# Patient Record
Sex: Male | Born: 1960 | Race: White | Hispanic: No | Smoking: Current every day smoker
Health system: Southern US, Community
[De-identification: ages and names within clinical notes are randomized; demographics above are authoritative.]

## PROBLEM LIST (undated history)

## (undated) DIAGNOSIS — F32A Depression, unspecified: Secondary | ICD-10-CM

## (undated) DIAGNOSIS — I872 Venous insufficiency (chronic) (peripheral): Secondary | ICD-10-CM

## (undated) DIAGNOSIS — E7800 Pure hypercholesterolemia, unspecified: Secondary | ICD-10-CM

## (undated) DIAGNOSIS — K759 Inflammatory liver disease, unspecified: Secondary | ICD-10-CM

## (undated) DIAGNOSIS — F419 Anxiety disorder, unspecified: Secondary | ICD-10-CM

## (undated) DIAGNOSIS — K5900 Constipation, unspecified: Secondary | ICD-10-CM

## (undated) DIAGNOSIS — Z86718 Personal history of other venous thrombosis and embolism: Secondary | ICD-10-CM

## (undated) DIAGNOSIS — E039 Hypothyroidism, unspecified: Secondary | ICD-10-CM

## (undated) DIAGNOSIS — F329 Major depressive disorder, single episode, unspecified: Secondary | ICD-10-CM

## (undated) HISTORY — PX: BACK SURGERY: SHX140

## (undated) HISTORY — DX: Personal history of other venous thrombosis and embolism: Z86.718

## (undated) HISTORY — PX: BELOW KNEE LEG AMPUTATION: SUR23

## (undated) HISTORY — DX: Venous insufficiency (chronic) (peripheral): I87.2

## (undated) HISTORY — PX: FOOT SURGERY: SHX648

---

## 1999-05-11 ENCOUNTER — Inpatient Hospital Stay (HOSPITAL_COMMUNITY): Admission: AD | Admit: 1999-05-11 | Discharge: 1999-05-14 | Payer: Self-pay | Admitting: Psychiatry

## 2001-01-04 ENCOUNTER — Encounter (HOSPITAL_COMMUNITY): Admission: RE | Admit: 2001-01-04 | Discharge: 2001-02-03 | Payer: Self-pay | Admitting: Rheumatology

## 2001-01-05 ENCOUNTER — Encounter: Payer: Self-pay | Admitting: Rheumatology

## 2001-07-31 ENCOUNTER — Inpatient Hospital Stay (HOSPITAL_COMMUNITY): Admission: AD | Admit: 2001-07-31 | Discharge: 2001-08-02 | Payer: Self-pay | Admitting: Psychiatry

## 2003-06-26 ENCOUNTER — Ambulatory Visit (HOSPITAL_COMMUNITY): Admission: RE | Admit: 2003-06-26 | Discharge: 2003-06-26 | Payer: Self-pay | Admitting: Family Medicine

## 2004-11-15 ENCOUNTER — Encounter: Admission: RE | Admit: 2004-11-15 | Discharge: 2005-02-13 | Payer: Self-pay | Admitting: Anesthesiology

## 2004-11-30 ENCOUNTER — Ambulatory Visit: Payer: Self-pay | Admitting: Anesthesiology

## 2005-03-08 ENCOUNTER — Ambulatory Visit (HOSPITAL_COMMUNITY): Admission: RE | Admit: 2005-03-08 | Discharge: 2005-03-08 | Payer: Self-pay | Admitting: Family Medicine

## 2005-03-15 ENCOUNTER — Encounter: Admission: RE | Admit: 2005-03-15 | Discharge: 2005-06-13 | Payer: Self-pay | Admitting: Anesthesiology

## 2005-03-15 ENCOUNTER — Ambulatory Visit: Payer: Self-pay | Admitting: Anesthesiology

## 2005-03-21 ENCOUNTER — Ambulatory Visit (HOSPITAL_COMMUNITY): Admission: RE | Admit: 2005-03-21 | Discharge: 2005-03-21 | Payer: Self-pay | Admitting: Family Medicine

## 2005-05-03 ENCOUNTER — Ambulatory Visit: Payer: Self-pay | Admitting: Anesthesiology

## 2005-05-31 ENCOUNTER — Ambulatory Visit: Payer: Self-pay | Admitting: Physical Medicine and Rehabilitation

## 2005-06-06 ENCOUNTER — Encounter
Admission: RE | Admit: 2005-06-06 | Discharge: 2005-09-04 | Payer: Self-pay | Admitting: Physical Medicine and Rehabilitation

## 2007-03-27 ENCOUNTER — Ambulatory Visit (HOSPITAL_COMMUNITY): Admission: RE | Admit: 2007-03-27 | Discharge: 2007-03-27 | Payer: Self-pay | Admitting: Neurosurgery

## 2007-04-04 ENCOUNTER — Inpatient Hospital Stay (HOSPITAL_COMMUNITY): Admission: RE | Admit: 2007-04-04 | Discharge: 2007-04-07 | Payer: Self-pay | Admitting: Neurosurgery

## 2008-11-03 ENCOUNTER — Ambulatory Visit (HOSPITAL_COMMUNITY): Admission: RE | Admit: 2008-11-03 | Discharge: 2008-11-03 | Payer: Self-pay | Admitting: Family Medicine

## 2009-11-05 ENCOUNTER — Ambulatory Visit (HOSPITAL_COMMUNITY): Admission: RE | Admit: 2009-11-05 | Discharge: 2009-11-05 | Payer: Self-pay | Admitting: Family Medicine

## 2010-06-11 ENCOUNTER — Ambulatory Visit (HOSPITAL_COMMUNITY)
Admission: RE | Admit: 2010-06-11 | Discharge: 2010-06-11 | Payer: Self-pay | Source: Home / Self Care | Admitting: Family Medicine

## 2010-08-01 ENCOUNTER — Encounter: Payer: Self-pay | Admitting: Family Medicine

## 2010-08-20 ENCOUNTER — Ambulatory Visit (HOSPITAL_COMMUNITY)
Admission: RE | Admit: 2010-08-20 | Discharge: 2010-08-20 | Disposition: A | Discharge status: Home / Self Care | Payer: BC Managed Care – PPO | Source: Ambulatory Visit | Attending: Family Medicine | Admitting: Family Medicine

## 2010-08-20 ENCOUNTER — Encounter (HOSPITAL_COMMUNITY): Payer: Self-pay

## 2010-08-20 ENCOUNTER — Other Ambulatory Visit (HOSPITAL_COMMUNITY): Payer: Self-pay | Admitting: Family Medicine

## 2010-08-20 DIAGNOSIS — R05 Cough: Secondary | ICD-10-CM | POA: Insufficient documentation

## 2010-08-20 DIAGNOSIS — R053 Chronic cough: Secondary | ICD-10-CM

## 2010-08-20 DIAGNOSIS — F17200 Nicotine dependence, unspecified, uncomplicated: Secondary | ICD-10-CM

## 2010-08-20 DIAGNOSIS — R059 Cough, unspecified: Secondary | ICD-10-CM | POA: Insufficient documentation

## 2010-11-23 NOTE — Op Note (Signed)
NAMERAYE, SLYTER NO.:  [id number]   MEDICAL RECORD NO.:  [id number]          PATIENT TYPE:  INP   LOCATION:  3041                         FACILITY:  [facility]   PHYSICIAN:  Hilda Lias, M.D.   DATE OF BIRTH:  10/01/1960   DATE OF PROCEDURE:  04/04/2007  DATE OF DISCHARGE:                               OPERATIVE REPORT   PREOPERATIVE DIAGNOSIS:  Degenerative disk disease, L5-S1, with chronic  radiculopathy and chronic back pain.   POSTOPERATIVE DIAGNOSES:  Degenerative disk disease, L5-S1, with chronic  radiculopathy and chronic back pain.   PROCEDURE:  1. Bilateral L5-S1 diskectomy.  2. Interbody fusion with cage and pedicle screws at L5-S1,      posterolateral arthrodesis.  Bone-morphogenic proteins autograft.   SURGEON:  Hilda Lias, M.D.   ASSISTANT:  Payton Doughty, M.D.   CLINICAL HISTORY:  [name] is a 50 year old gentleman who I have been  following for more than 7 years in my office because of back pain with  radiation to both legs.  He is getting worse, despite every single  conservative treatment.  In the end, he wanted to proceed with surgery.  The risks were explained to him as in the history and physical.   PROCEDURE:  [name] was taken to the OR and he was positioned in a  prone manner.  The back was cleaned with DuraPrep.  A midline incision  from L4-L5 to S1 was made and muscles were retracted laterally until we  were able to see and feel the transverse process of L5.  X-rays showed  that we were at the level of L5-S1.  From then on, the spinous process  and the lamina, as well as the facet of L5 were removed.  The patient  had quite a bit of hypertrophy of the facet.  He had quite a bit of  adhesion and lysis was accomplished.  We were going to retract the L5-S1  nerve root and we entered the disk space, which was quite narrow.  We  introduced microcurettes and at the end, we were able to remove the disk  bilaterally.  The  endplate was shaved.  Then 2 cages of 8 x 22 with BMP  at the tip autograft for the rest were introduced.  The rest of the disk  space was filled up with bone graft..  X-rays showed that we were in  good position with both of our cages.  Then with the C-arm first in an  AP view and then a lateral view, we were able to drill the pedicle of L5-  S1.  Then pedicle probes were introduced.  Then 4 screws 5.5 x 45 at the  level of L5 and 5.5 x 50 at the level of S1 were introduced.  Good  alignment was achieved.  Then the pedicles were kept in place with a rod  and kept in place with caps.  We went laterally and we removed the  periosteum of the  ala of the sacrum, as well as the transverse process and the lateral  aspect of  the facet.  Arthrodesis using autograft and BMP was  accomplished.  The area was irrigated.  Fentanyl was left in the  epidural space and the wound was closed with Vicryl and Steri-Strips.           ______________________________  Hilda Lias, M.D.     EB/MEDQ  D:  04/04/2007  T:  04/05/2007  Job:  [id number]

## 2010-11-23 NOTE — H&P (Signed)
[name], [name] NO.:  [id number]   MEDICAL RECORD NO.:  [id number]          PATIENT TYPE:  INP   LOCATION:  3041                         FACILITY:  [facility]   PHYSICIAN:  Hilda Lias, M.D.   DATE OF BIRTH:  10-Mar-1961   DATE OF ADMISSION:  04/04/2007  DATE OF DISCHARGE:                              HISTORY & PHYSICAL   HISTORY OF PRESENT ILLNESS:  [name] is a gentleman who has been seen  by me since April 2002, complaining of low back pain which is getting  worse for the past 6 years.  The pain goes down to both legs and he is  quite miserable.  The patient had had conservative treatment including  pain clinic, physical therapy, medication and he is not any better.  We  have not done several x-ray through the years and indeed, the x-ray is  getting worse to the point that he has severe degenerative disk disease  at the level 5-1.  Lately, he had been unable to work and because of  that, he wants to proceed with surgery.   PAST MEDICAL HISTORY:  Vasectomy.   SOCIAL HISTORY:  He does not drink.  He smokes a pack a day.   FAMILY HISTORY:  Father had cancer of the prostate.  Mother had a  stroke.   REVIEW OF SYSTEMS:  Positive for anxiety, back and leg pain, arthritis.   PHYSICAL EXAMINATION:  HEENT:  Normal.  NECK:  Normal.  LUNGS:  Clear.  CARDIAC:  Heart sounds normal.  ABDOMEN:  Normal.  EXTREMITIES:  Norma pulses.  NEUROLOGIC:  He has some mild weakness of dorsiflexion of both feet.  Straight leg raising, SLR, is positive at 45 degrees.  He has decreased  flexibility of the lumbar spine.   IMAGING STUDY:  Lumbar spine as well as the MRI shows severe  degenerative disk disease at the level of 5-1.   CLINICAL IMPRESSION:  Degenerative disk disease, L5-S1, with a chronic  radiculopathy.   RECOMMENDATIONS:  The patient wants to proceed with surgery.  The  procedure will be an L5-S1 diskectomy, interbody fusion with pedicle  screws using the  BMP and autograft.  The patient knows about the risks  such as infection, CSF leak, worsening pain, bleeding of the abdomen,  need for surgery, failure of the graft and no improvement because of the  chronicity of the pain.           ______________________________  Hilda Lias, M.D.    EB/MEDQ  D:  04/04/2007  T:  04/05/2007  Job:  [id number]

## 2010-11-23 NOTE — Discharge Summary (Signed)
NAMEKAYLOR, SIMENSON NO.:  [id number]   MEDICAL RECORD NO.:  [id number]          PATIENT TYPE:  INP   LOCATION:  3041                         FACILITY:  [facility]   PHYSICIAN:  Hilda Lias, M.D.   DATE OF BIRTH:  1961/03/12   DATE OF ADMISSION:  04/04/2007  DATE OF DISCHARGE:  04/07/2007                               DISCHARGE SUMMARY   ADMISSION DIAGNOSES:  Degenerative disk disease, L5-S1, with chronic  radiculopathy.   HISTORY:  The patient has been complaining of back pain for almost seven  years.  I have been following him for almost six years.  He is getting  worse.  X-rays showed degenerative disk disease at the L5-S1 video.  Surgery was advised.   LABORATORY DATA:  Normal.   HOSPITAL COURSE:  On the day of admission, the patient was taken to  surgery and a fusion of L5-S1 was done.  The first 48-hours, the patient  complained of quite a bit of pain but today he is doing much better.  He  is walking.  He has minimal pain.  No weakness in the legs.  He feels  that he is ready to go home.   CONDITION ON DISCHARGE:  Improved.   DISCHARGE MEDICATIONS:  1. Percocet.  2. Diazepam.  3. He is to continue all of his previous medications.   DISCHARGE DIET:  Regular.   DISCHARGE ACTIVITIES:  Not to do for at least two weeks.   FOLLOWUP:  To be seen by me in the second week of November or before as  needed.           ______________________________  Hilda Lias, M.D.     EB/MEDQ  D:  04/07/2007  T:  04/07/2007  Job:  [id number]
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
## 2010-11-26 NOTE — Assessment & Plan Note (Signed)
[name] comes in for pain management today.  I evaluated him with a  __________ history form and a 14-point review of systems.   KIND OF REFERRAL:  Referral from Dr. Jeral Fruit.   He has been assessed from a surgical standpoint; we are consulted for  consideration of management of pain in the lumbar region and possible  interventional procedures to improve function and quality of life indices.  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  He has had steady escalation  of back pain, declining functional indices and quality of life indices.  I  reviewed the MRI, which revealed degenerative components to the lower lumbar  segments, particularly 5145 with facettal overlay.  He is a smoker, and I  discussed modifiable features and help profile to improve function and  quality of life and will maintain contact with primary care.  He apparently  does not see primary care often, and I encouraged.  This is an outcome  predictive element, and I review it within the context of overall best  outcome, and surgery is considered imminent.  He wants to do what he can to  avoid moving into the surgical arena as he does have financial stressors.  His pain is a 6/10 subjective scale, dull, achy, and throbbing with no  temporal relation to the day, made worse by standing and activity.  It is  worse at night.  He has difficulty with most ambulatory activities.  His 14-  point review of systems, health and history form reviewed.  Elements of  review of systems, past medical and social history are otherwise accompanied  to the charts.  Medications reviewed as well.  He is currently not taking  narcotic-based pain medication, relying on Lidoderm and Skelaxin.   PHYSICAL EXAMINATION:  GENERAL:  Physical exam reveals a pleasant male,  sitting comfortably in bed.  Good affect.  Appearance is normal.  Oriented  x3.  HEENT:   Unremarkable.  CHEST:  Clear to auscultation and percussion.  HEART:  Regular rate and rhythm without rub, murmur, or gallop.  ABDOMEN:  Soft, nontender, benign, no hepatosplenomegaly.  He has diffuse  paralumbar myofascial pain over PSIS and notable pain on extension.  Gaenslen's and  Patrick's equivocal.  NEUROLOGIC:  He has no other overt neurological deficits.  Motor and sensory  __________ .   IMPRESSION:  Degenerative spine disease, lumbar spine facets and  radiculopathy.   PLAN:  1.  We will arrange for lumbar epidural.  He will have a driver, and I have      reviewed the risks, complications, and options.  2.  Cigarette cessation for best outcome.  3.  In the interim, I will go ahead and give him a few hydrocodone with      cautions given.  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  He also understands the risk of potential      habituation and utilization of this medication within the context of      functional enhancements.  4.  He will maintain contact with Dr. Murray Hodgkins.  5.  I will see him in followup for this interventional procedure.  Consider      facettal injection with positive provocative __________ , consideration      of radiofrequency neuro ablation  at a later date if necessary.  There is      no barrier to communication.  He understands the opioid consent and      patient care agreement, and adequate time was allotted for questions.      HH/MedQ  D:  11/30/2004 10:21:53  T:  11/30/2004 11:04:55  Job #:  [id number]   cc:   Dr. Phillips Odor

## 2010-11-26 NOTE — Procedures (Signed)
NAMEMADDEN, GARRON NO.:  [id number]   MEDICAL RECORD NO.:  [id number]          PATIENT TYPE:  REC   LOCATION:  TPC                          FACILITY:  [facility]   PHYSICIAN:  Celene Kras, MD        DATE OF BIRTH:  [birth year]   DATE OF PROCEDURE:  05/03/2005  DATE OF DISCHARGE:                                 OPERATIVE REPORT   PATIENT:  [name].   DATE OF BIRTH:  [birth year].   SURGEON:  Jewel Baize. Stevphen Rochester, M.D.   Lenis [facility]ettleton [facility] and I evaluated  him. I have reviewed the Health and history form. I reviewed the 14 point  review of systems.   I reviewed MRI and progress to date. Directed leftward, his pain has more a  radicular pattern consistent with 5-1/4-5 to the left although he does have  some right sided pain and overlay to a mechanical component. He has both a  mixed anterior and posterior compartment element, facet and radicular pain,  supported by the MRI and physical findings. As predictably, he has pain in a  5-1 distribution as well as 4-5, demonstrated MRI, and probably some neural  encroachment particularly at the 5-1 level. He has no advancing neurological  features, bowel and bladder are intact, EHL is fine. I am going to refer him  on to the neurosurgeon, but he wants to wait, see how the injections go, and  that is fine for now, but should he have any advancing neurological features  or from a pain management perspective we do not make significant progress,  we are going to have to involve our surgical colleagues. He saw Dr. Jeral Fruit a  number of years, and he states that he really cannot afford to see a  neurosurgeon at this time, and he has to work, and he would not even  consider surgery anyway. To this end, we will trial him on a multimodality  standpoint, inject him, follow him along expectantly, and maximize  conservative management. This does include and he is clear on this that he  maintain  contact with primary care for cigarette cessation and lifestyle  enhancement. We will consider TENS technology or muscle stimulator. We want  to minimize controlled substances, but I will go ahead and give him a few  Percocet during the injection cycling to utilizing sparingly and  appropriately. He understands the risks of these medications, cautions  given, opioid consent, patient care agreement.   OBJECTIVE:  Diffuse paralumbar myofascial discomfort with pain over PSIS,  side bending positive, right and left. Straight leg left impaired, left  greater than right. EHL is fine. Radicular component over the mechanical  component today. No new neurological findings motor, sensory or reflexive.   IMPRESSION:  Radiculopathy, degenerative spine disease of lumbar spine.   PLAN:  1.  Most problematic site is left side, much more so than right, and so I      will focus on left transforaminal injection today, L4-5, 5-1 independent  needle axis points under local anesthetic. Diagnostic and therapeutic.      Will follow up with him in a month, and I do not plan another injection.      I would like to see how he does. In the interim, he will see primary      care and start working on lifestyle components that will affect outcome      such as cigarette cessation.  2.  He will see our physiatry colleagues to assess the biomechanical model.  3.  He understands the risks of this procedure and wishes to proceed.   The patient was taken to the fluoroscopy suite and placed in prone position,  the back prepped and draped in usual fashion. Using a 22-gauge blunted  spinal needle under local anesthetic, I advanced the transforaminal epidural  space at L4-5 and 5-1 independent needle axis points, left side. I confirmed  placement. I used multiple fluoroscopic positions and Isovue 200. Once  confirmation is made and contrast appropriate, we inject in a withdrawn  needle technique 2 cc of lidocaine 1% MPF  and 20 mg of Aristocort at each  level.   He tolerated the procedure well. No complication from our procedure.  Appropriate recovery ______________ instructions given. No barrier to  communication. I will see him in followup.           ______________________________  Celene Kras, MD     HH/MEDQ  D:  05/03/2005 11:37:58  T:  05/03/2005 16:07:25  Job:  [id number]

## 2010-11-26 NOTE — Procedures (Signed)
NAMETHAINE, GARRIGA NO.:  [id number]   MEDICAL RECORD NO.:  [id number]          PATIENT TYPE:  REC   LOCATION:  TPC                          FACILITY:  [facility]   PHYSICIAN:  Celene Kras, MD        DATE OF BIRTH:  01-08-1961   DATE OF PROCEDURE:  12/28/2004  DATE OF DISCHARGE:                                 OPERATIVE REPORT   PATIENT:  [name].   DATE OF BIRTH:  1961-06-22.   SURGEON:  Jewel Baize. Stevphen Rochester, M.D.   [name] and I evaluated  him. I have reviewed the Health and history form. I reviewed the 14 point  review of systems.   1.  He is scheduled for lumbar epidural. Risks, complications, and options      are fully outlined.  2.  Other modifiable features in health profile such as cigarette cessation      are discussed.  3.  I will follow up with him to determine further course of care. Consider      second injection if warranted. Consider facetal injection as well. This      is elaborated to him.  No barriers to communication. Discussed with him, adequate time to answer  questions.   OBJECTIVE:  Diffuse paralumbar myofascial discomfort, pain over PSIS,  notable pain on extension with Gaenslen's and Patrick's equivocal. No new  neurological findings motor, sensory or reflexive.   IMPRESSION:  Degenerative spine disease of lumbar spine.   PLAN:  Lumbar epidural. He has consented.   The patient taken to the fluoroscopy suite and placed in prone position.  Back prepped and draped in usual fashion. Using a Hustead needle, I advanced  to the L5-S1 interspace without any evidence of CSF, heme or paresthesia.  Test block uneventfully. Followed with 40 mg of Aristocort and flushed  needle.   He tolerated the procedure well. No complications from our procedure.  Appropriate recovery. Discharge instructions given. We will see him in  followup.       HH/MEDQ  D:  12/28/2004 11:44:07  T:   12/28/2004 12:53:45  Job:  [id number]

## 2010-11-26 NOTE — Procedures (Signed)
NAMEURIAN, MARTENSON NO.:  [id number]   MEDICAL RECORD NO.:  [id number]          PATIENT TYPE:  REC   LOCATION:  TPC                          FACILITY:  [facility]   PHYSICIAN:  Celene Kras, MD        DATE OF BIRTH:  December 09, 1960   DATE OF PROCEDURE:  DATE OF DISCHARGE:                                 OPERATIVE REPORT   PATIENT:  [name].   DATE OF BIRTH:  09/29/1960.   SURGEON:  Jewel Baize. Stevphen Rochester, M.D.   [name] and I evaluated  and reviewed the Health and history form. I reviewed the 14 point review of  systems.   1.  It has been a number of months since we have seen [name]. He had a fair to      equivocal response from lumbar epidural, but did not followup in      expectation of second in a series. We think another one is warranted and      consider the facet as a pain generator. If he does not have a      significant improvement, we would move on to the mechanical components      of his low back pain, that being the facet mediated pain. Diagnostically      and therapeutically, should we inject the facet, we would consider      radiofrequency neural ablation with positive provocative experience.  2.  Again other modifiable features in the health profile are discussed such      as cigarette cessation and weight control. He is working about 60-80      hours a week and we caution.  3.  Maintain contact with primary care and referring physician.   No interval change, do not believe further imaging or diagnostics are  warranted.   OBJECTIVE:  Diffuse paralumbar myofascial discomfort and pain over PSI.  There is notable pain on extension, Gaenslen's and Patrick's equivocal. No  new neurological findings motor, sensory or reflexive.   IMPRESSION:  Degenerative spinal disease of the lumbar spine.   PLAN:  Lumbar epidural, he has consented.   PROCEDURE:  The patient was taken to the fluoroscopy suite, placed in  the  prone position, back prepped and draped in the usual fashion. Using a  Hustead needle, I advanced to the L5-S1 interspace without any evidence of  CSF, heme or paresthesias. Test block uneventfully. Followup with 40 mg of  Aristocort and flush the needle.   He tolerated the procedure well with no complications from our procedure.  Appropriate recovery. Discharge instructions given. No barrier to  communications.           ______________________________  Celene Kras, MD     HH/MEDQ  D:  03/15/2005 11:28:11  T:  03/15/2005 11:54:59  Job:  [id number]
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
## 2011-04-21 LAB — TYPE AND SCREEN
ABO/RH(D): O POS
Antibody Screen: NEGATIVE

## 2011-04-21 LAB — BASIC METABOLIC PANEL
BUN: 6
CO2: 29
Calcium: 9.3
Chloride: 99
Creatinine, Ser: 0.83
GFR calc Af Amer: >60
GFR calc non Af Amer: >60
Glucose, Bld: 88
Potassium: 4.3
Sodium: 134 — ABNORMAL LOW

## 2011-04-21 LAB — CBC
HCT: 41.5
Hemoglobin: 14.1
MCHC: 33.9
MCV: 96.3
Platelets: 231
RBC: 4.31
RDW: 12.4
WBC: 9.3

## 2011-04-21 LAB — HEPATIC FUNCTION PANEL
ALT: 26
AST: 24
Albumin: 3.7
Alkaline Phosphatase: 66
Bilirubin, Direct: 0.2
Indirect Bilirubin: 0.3
Total Bilirubin: 0.5
Total Protein: 6.6

## 2011-04-21 LAB — ABO/RH: ABO/RH(D): O POS

## 2011-05-13 ENCOUNTER — Telehealth: Payer: Self-pay

## 2011-05-13 NOTE — Telephone Encounter (Signed)
LMOM to call on 05/12/2011.

## 2011-05-24 NOTE — Telephone Encounter (Signed)
Per Crystal, pt called and said he does not want to do colonoscopy now, he will call when he is ready.

## 2011-05-24 NOTE — Telephone Encounter (Signed)
Faxed note to [facility] medical to inform Dr. Regino Schultze.

## 2011-08-26 ENCOUNTER — Encounter: Payer: Self-pay | Admitting: Vascular Surgery

## 2011-08-31 ENCOUNTER — Encounter: Payer: Self-pay | Admitting: Vascular Surgery

## 2011-09-01 ENCOUNTER — Encounter: Payer: BC Managed Care – PPO | Admitting: Vascular Surgery

## 2013-07-22 ENCOUNTER — Other Ambulatory Visit (HOSPITAL_COMMUNITY): Payer: Self-pay | Admitting: Internal Medicine

## 2013-07-22 ENCOUNTER — Ambulatory Visit (HOSPITAL_COMMUNITY)
Admission: RE | Admit: 2013-07-22 | Discharge: 2013-07-22 | Disposition: A | Discharge status: Home / Self Care | Payer: BC Managed Care – PPO | Source: Ambulatory Visit | Attending: Internal Medicine | Admitting: Internal Medicine

## 2013-07-22 DIAGNOSIS — M67919 Unspecified disorder of synovium and tendon, unspecified shoulder: Secondary | ICD-10-CM

## 2013-07-22 DIAGNOSIS — M25519 Pain in unspecified shoulder: Secondary | ICD-10-CM | POA: Insufficient documentation

## 2013-07-22 DIAGNOSIS — M719 Bursopathy, unspecified: Principal | ICD-10-CM

## 2013-07-22 DIAGNOSIS — M259 Joint disorder, unspecified: Secondary | ICD-10-CM | POA: Insufficient documentation

## 2014-01-02 ENCOUNTER — Other Ambulatory Visit (HOSPITAL_COMMUNITY): Payer: Self-pay | Admitting: Internal Medicine

## 2014-01-02 DIAGNOSIS — M259 Joint disorder, unspecified: Secondary | ICD-10-CM

## 2014-01-06 ENCOUNTER — Ambulatory Visit (HOSPITAL_COMMUNITY): Payer: BC Managed Care – PPO

## 2014-01-07 ENCOUNTER — Other Ambulatory Visit (HOSPITAL_COMMUNITY): Payer: Self-pay | Admitting: Internal Medicine

## 2014-01-07 ENCOUNTER — Ambulatory Visit (HOSPITAL_COMMUNITY)
Admission: RE | Admit: 2014-01-07 | Discharge: 2014-01-07 | Disposition: A | Discharge status: Home / Self Care | Payer: BC Managed Care – PPO | Source: Ambulatory Visit | Attending: Internal Medicine | Admitting: Internal Medicine

## 2014-01-07 DIAGNOSIS — M549 Dorsalgia, unspecified: Secondary | ICD-10-CM

## 2014-01-07 DIAGNOSIS — M25569 Pain in unspecified knee: Secondary | ICD-10-CM | POA: Insufficient documentation

## 2014-01-07 DIAGNOSIS — M5137 Other intervertebral disc degeneration, lumbosacral region: Secondary | ICD-10-CM | POA: Insufficient documentation

## 2014-01-07 DIAGNOSIS — Z981 Arthrodesis status: Secondary | ICD-10-CM | POA: Insufficient documentation

## 2014-01-07 DIAGNOSIS — M545 Low back pain, unspecified: Secondary | ICD-10-CM | POA: Insufficient documentation

## 2014-01-07 DIAGNOSIS — M2240 Chondromalacia patellae, unspecified knee: Secondary | ICD-10-CM | POA: Insufficient documentation

## 2014-01-07 DIAGNOSIS — M51379 Other intervertebral disc degeneration, lumbosacral region without mention of lumbar back pain or lower extremity pain: Secondary | ICD-10-CM | POA: Insufficient documentation

## 2014-01-07 DIAGNOSIS — M259 Joint disorder, unspecified: Secondary | ICD-10-CM

## 2014-01-07 DIAGNOSIS — M6789 Other specified disorders of synovium and tendon, multiple sites: Secondary | ICD-10-CM | POA: Insufficient documentation

## 2014-01-30 ENCOUNTER — Ambulatory Visit (INDEPENDENT_AMBULATORY_CARE_PROVIDER_SITE_OTHER): Payer: BC Managed Care – PPO | Admitting: Orthopedic Surgery

## 2014-01-30 ENCOUNTER — Ambulatory Visit (INDEPENDENT_AMBULATORY_CARE_PROVIDER_SITE_OTHER): Payer: BC Managed Care – PPO

## 2014-01-30 ENCOUNTER — Encounter: Payer: Self-pay | Admitting: Orthopedic Surgery

## 2014-01-30 VITALS — BP 95/74 | Ht 68.0 in | Wt 205.0 lb

## 2014-01-30 DIAGNOSIS — M25562 Pain in left knee: Secondary | ICD-10-CM

## 2014-01-30 DIAGNOSIS — M25569 Pain in unspecified knee: Secondary | ICD-10-CM

## 2014-01-30 DIAGNOSIS — M1712 Unilateral primary osteoarthritis, left knee: Secondary | ICD-10-CM

## 2014-01-30 DIAGNOSIS — M1710 Unilateral primary osteoarthritis, unspecified knee: Secondary | ICD-10-CM

## 2014-01-30 NOTE — Patient Instructions (Signed)
Joint Injection  Care After  Refer to this sheet in the next few days. These instructions provide you with information on caring for yourself after you have had a joint injection. Your caregiver also may give you more specific instructions. Your treatment has been planned according to current medical practices, but problems sometimes occur. Call your caregiver if you have any problems or questions after your procedure.  After any type of joint injection, it is not uncommon to experience:  · Soreness, swelling, or bruising around the injection site.  · Mild numbness, tingling, or weakness around the injection site caused by the numbing medicine used before or with the injection.  It also is possible to experience the following effects associated with the specific agent after injection:  · Iodine-based contrast agents:  ¨ Allergic reaction (itching, hives, widespread redness, and swelling beyond the injection site).  · Corticosteroids (These effects are rare.):  ¨ Allergic reaction.  ¨ Increased blood sugar levels (If you have diabetes and you notice that your blood sugar levels have increased, notify your caregiver).  ¨ Increased blood pressure levels.  ¨ Mood swings.  · Hyaluronic acid in the use of viscosupplementation.  ¨ Temporary heat or redness.  ¨ Temporary rash and itching.  ¨ Increased fluid accumulation in the injected joint.  These effects all should resolve within a day after your procedure.   HOME CARE INSTRUCTIONS  · Limit yourself to light activity the day of your procedure. Avoid lifting heavy objects, bending, stooping, or twisting.  · Take prescription or over-the-counter pain medication as directed by your caregiver.  · You may apply ice to your injection site to reduce pain and swelling the day of your procedure. Ice may be applied 03-04 times:  ¨ Put ice in a plastic bag.  ¨ Place a towel between your skin and the bag.  ¨ Leave the ice on for no longer than 15-20 minutes each time.  SEEK  IMMEDIATE MEDICAL CARE IF:   · Pain and swelling get worse rather than better or extend beyond the injection site.  · Numbness does not go away.  · Blood or fluid continues to leak from the injection site.  · You have chest pain.  · You have swelling of your face or tongue.  · You have trouble breathing or you become dizzy.  · You develop a fever, chills, or severe tenderness at the injection site that last longer than 1 day.  MAKE SURE YOU:  · Understand these instructions.  · Watch your condition.  · Get help right away if you are not doing well or if you get worse.  Document Released: 03/10/2011 Document Revised: 09/19/2011 Document Reviewed: 03/10/2011  ExitCare® Patient Information ©2015 [employer]. This information is not intended to replace advice given to you by your health care provider. Make sure you discuss any questions you have with your health care provider.
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
## 2014-05-12 DIAGNOSIS — G894 Chronic pain syndrome: Secondary | ICD-10-CM | POA: Diagnosis not present

## 2014-05-12 DIAGNOSIS — Z23 Encounter for immunization: Secondary | ICD-10-CM | POA: Diagnosis not present

## 2014-05-12 DIAGNOSIS — E6609 Other obesity due to excess calories: Secondary | ICD-10-CM | POA: Diagnosis not present

## 2014-05-12 DIAGNOSIS — Z6831 Body mass index (BMI) 31.0-31.9, adult: Secondary | ICD-10-CM | POA: Diagnosis not present

## 2014-05-16 DIAGNOSIS — I82511 Chronic embolism and thrombosis of right femoral vein: Secondary | ICD-10-CM | POA: Diagnosis not present

## 2014-05-16 DIAGNOSIS — I82531 Chronic embolism and thrombosis of right popliteal vein: Secondary | ICD-10-CM | POA: Diagnosis not present

## 2014-05-16 DIAGNOSIS — I82512 Chronic embolism and thrombosis of left femoral vein: Secondary | ICD-10-CM | POA: Diagnosis not present

## 2014-05-16 DIAGNOSIS — I824Y9 Acute embolism and thrombosis of unspecified deep veins of unspecified proximal lower extremity: Secondary | ICD-10-CM | POA: Diagnosis not present

## 2014-05-16 DIAGNOSIS — D6852 Prothrombin gene mutation: Secondary | ICD-10-CM | POA: Diagnosis not present

## 2014-05-16 DIAGNOSIS — Z87828 Personal history of other (healed) physical injury and trauma: Secondary | ICD-10-CM | POA: Diagnosis not present

## 2014-05-16 DIAGNOSIS — I824Y3 Acute embolism and thrombosis of unspecified deep veins of proximal lower extremity, bilateral: Secondary | ICD-10-CM | POA: Diagnosis not present

## 2014-05-16 DIAGNOSIS — Z89511 Acquired absence of right leg below knee: Secondary | ICD-10-CM | POA: Diagnosis not present

## 2014-05-16 DIAGNOSIS — Z7901 Long term (current) use of anticoagulants: Secondary | ICD-10-CM | POA: Diagnosis not present

## 2014-06-03 DIAGNOSIS — G894 Chronic pain syndrome: Secondary | ICD-10-CM | POA: Diagnosis not present

## 2014-06-03 DIAGNOSIS — M1991 Primary osteoarthritis, unspecified site: Secondary | ICD-10-CM | POA: Diagnosis not present

## 2014-06-03 DIAGNOSIS — Z6830 Body mass index (BMI) 30.0-30.9, adult: Secondary | ICD-10-CM | POA: Diagnosis not present

## 2014-06-03 DIAGNOSIS — E063 Autoimmune thyroiditis: Secondary | ICD-10-CM | POA: Diagnosis not present

## 2014-06-10 ENCOUNTER — Encounter (INDEPENDENT_AMBULATORY_CARE_PROVIDER_SITE_OTHER): Payer: Self-pay | Admitting: *Deleted

## 2014-07-07 DIAGNOSIS — E6609 Other obesity due to excess calories: Secondary | ICD-10-CM | POA: Diagnosis not present

## 2014-07-07 DIAGNOSIS — Z6831 Body mass index (BMI) 31.0-31.9, adult: Secondary | ICD-10-CM | POA: Diagnosis not present

## 2014-07-07 DIAGNOSIS — G894 Chronic pain syndrome: Secondary | ICD-10-CM | POA: Diagnosis not present

## 2014-07-23 ENCOUNTER — Ambulatory Visit (INDEPENDENT_AMBULATORY_CARE_PROVIDER_SITE_OTHER): Payer: BC Managed Care – PPO | Admitting: Internal Medicine

## 2014-08-04 DIAGNOSIS — Z6830 Body mass index (BMI) 30.0-30.9, adult: Secondary | ICD-10-CM | POA: Diagnosis not present

## 2014-08-04 DIAGNOSIS — G894 Chronic pain syndrome: Secondary | ICD-10-CM | POA: Diagnosis not present

## 2014-09-15 ENCOUNTER — Encounter (INDEPENDENT_AMBULATORY_CARE_PROVIDER_SITE_OTHER): Payer: Self-pay | Admitting: *Deleted

## 2014-09-29 ENCOUNTER — Ambulatory Visit (INDEPENDENT_AMBULATORY_CARE_PROVIDER_SITE_OTHER): Payer: Self-pay | Admitting: Internal Medicine

## 2014-10-28 ENCOUNTER — Ambulatory Visit (INDEPENDENT_AMBULATORY_CARE_PROVIDER_SITE_OTHER): Payer: Self-pay | Admitting: Internal Medicine

## 2014-11-24 ENCOUNTER — Ambulatory Visit (INDEPENDENT_AMBULATORY_CARE_PROVIDER_SITE_OTHER): Payer: Self-pay | Admitting: Internal Medicine

## 2015-03-23 ENCOUNTER — Telehealth: Payer: Self-pay

## 2015-03-23 NOTE — Telephone Encounter (Signed)
Pt returned call. I asked him about his previous appt's at Dr. Olevia Perches office. He said he thought this was Dr. Olevia Perches. I gave him their phone number to call and faxed a note to [facility] to send referral back to Dr. Laural Golden.

## 2015-03-23 NOTE — Telephone Encounter (Signed)
Pt left Vm that he is ready to schedule colonoscopy. I have called him @ [phone] and [facility] for a return call to find out why he has not followed up with NUR as previously scheduled.

## 2015-03-24 ENCOUNTER — Encounter (INDEPENDENT_AMBULATORY_CARE_PROVIDER_SITE_OTHER): Payer: Self-pay | Admitting: *Deleted

## 2015-04-13 NOTE — Telephone Encounter (Signed)
Pt had left Vm that he was calling to schedule an appt for colonoscopy with Dr. Laural Golden. I called and left Vm that this is not Dr. Olevia Perches office. But I see he has received a letter from [place]. I told him to call her at [phone] between 8:30 Am and 4:30 pm on Wednesday or Thursday.

## 2015-04-17 ENCOUNTER — Encounter (INDEPENDENT_AMBULATORY_CARE_PROVIDER_SITE_OTHER): Payer: Self-pay | Admitting: *Deleted

## 2015-05-22 DIAGNOSIS — I82531 Chronic embolism and thrombosis of right popliteal vein: Secondary | ICD-10-CM | POA: Diagnosis not present

## 2015-05-22 DIAGNOSIS — I82513 Chronic embolism and thrombosis of femoral vein, bilateral: Secondary | ICD-10-CM | POA: Diagnosis not present

## 2015-05-22 DIAGNOSIS — Z89511 Acquired absence of right leg below knee: Secondary | ICD-10-CM | POA: Diagnosis not present

## 2015-05-22 DIAGNOSIS — Z7901 Long term (current) use of anticoagulants: Secondary | ICD-10-CM | POA: Diagnosis not present

## 2015-06-01 ENCOUNTER — Telehealth (INDEPENDENT_AMBULATORY_CARE_PROVIDER_SITE_OTHER): Payer: Self-pay | Admitting: *Deleted

## 2015-06-01 ENCOUNTER — Other Ambulatory Visit (INDEPENDENT_AMBULATORY_CARE_PROVIDER_SITE_OTHER): Payer: Self-pay | Admitting: Internal Medicine

## 2015-06-01 DIAGNOSIS — Z1211 Encounter for screening for malignant neoplasm of colon: Secondary | ICD-10-CM

## 2015-06-01 NOTE — Telephone Encounter (Signed)
Patient needs trilyte 

## 2015-06-01 NOTE — Telephone Encounter (Signed)
Per Dr Ethlyn Gallery it is ok for patient t stop Xarelto 2 days prior to TCS which is sch'd 06/26/15

## 2015-06-01 NOTE — Telephone Encounter (Signed)
Referring MD/PCP: fusco   Procedure: tcs with propofol  Reason/Indication:  screening  Has patient had this procedure before?  no  If so, when, by whom and where?    Is there a family history of colon cancer?  no  Who?  What age when diagnosed?    Is patient diabetic?   no      Does patient have prosthetic heart valve or mechanical valve?  no  Do you have a pacemaker?  no  Has patient ever had endocarditis? no  Has patient had joint replacement within last 12 months?  no  Does patient tend to be constipated or take laxatives? yes  Does patient have a history of alcohol/drug use?  no  Is patient on Coumadin, Plavix and/or Aspirin? yes  Medications: xarelto 20 mg daily, (fusco), levothyroxine 25 mcg daily, pravastatin 20 mg daily, xanax 1 mg at bedtime, seroquel 100 mg bid, oxycodone 30 mg 8 tab daily  Allergies: nkda  Medication Adjustment: xarelto 2 days before  Procedure date & time: 06/26/15 at 1010

## 2015-06-02 MED ORDER — PEG 3350-KCL-NA BICARB-NACL 420 G PO SOLR: 4000.0000 mL | Freq: Once | ORAL | Status: DC

## 2015-06-02 NOTE — Telephone Encounter (Signed)
agree

## 2015-06-22 NOTE — Patient Instructions (Signed)
[name]  06/22/2015     @PREFPERIOPPHARMACY @   Your procedure is scheduled on 06/26/15.  Report to [facility] at 8:40 A.M.  Call this number if you have problems the morning of surgery:  [phone]   Remember:  Do not eat food or drink liquids after midnight.  Take these medicines the morning of surgery with A SIP OF WATER Albuterol inhaler (bring with you to hospital), Xanax, Flonase, Synthroid, Singulair, Prilosec  Oxycontin or Opana if needed, Stop Xarelto 2 days prior to procedure, per office instructions   Do not wear jewelry, make-up or nail polish.  Do not wear lotions, powders, or perfumes.  You may wear deodorant.  Do not shave 48 hours prior to surgery.  Men may shave face and neck.  Do not bring valuables to the hospital.  [facility] is not responsible for any belongings or valuables.  Contacts, dentures or bridgework may not be worn into surgery.  Leave your suitcase in the car.  After surgery it may be brought to your room.  For patients admitted to the hospital, discharge time will be determined by your treatment team.  Patients discharged the day of surgery will not be allowed to drive home.    Please read over the following fact sheets that you were given. Anesthesia Post-op Instructions     PATIENT INSTRUCTIONS POST-ANESTHESIA  IMMEDIATELY FOLLOWING SURGERY:  Do not drive or operate machinery for the first twenty four hours after surgery.  Do not make any important decisions for twenty four hours after surgery or while taking narcotic pain medications or sedatives.  If you develop intractable nausea and vomiting or a severe headache please notify your doctor immediately.  FOLLOW-UP:  Please make an appointment with your surgeon as instructed. You do not need to follow up with anesthesia unless specifically instructed to do so.  WOUND CARE INSTRUCTIONS (if applicable):  Keep a dry clean dressing on the anesthesia/puncture wound site if there is  drainage.  Once the wound has quit draining you may leave it open to air.  Generally you should leave the bandage intact for twenty four hours unless there is drainage.  If the epidural site drains for more than 36-48 hours please call the anesthesia department.  QUESTIONS?:  Please feel free to call your physician or the hospital operator if you have any questions, and they will be happy to assist you.      Colonoscopy A colonoscopy is an exam to look at the entire large intestine (colon). This exam can help find problems such as tumors, polyps, inflammation, and areas of bleeding. The exam takes about 1 hour.  LET [facility] CARE PROVIDER KNOW ABOUT:   Any allergies you have.  All medicines you are taking, including vitamins, herbs, eye drops, creams, and over-the-counter medicines.  Previous problems you or members of your family have had with the use of anesthetics.  Any blood disorders you have.  Previous surgeries you have had.  Medical conditions you have. RISKS AND COMPLICATIONS  Generally, this is a safe procedure. However, as with any procedure, complications can occur. Possible complications include:  Bleeding.  Tearing or rupture of the colon wall.  Reaction to medicines given during the exam.  Infection (rare). BEFORE THE PROCEDURE   Ask your health care provider about changing or stopping your regular medicines.  You may be prescribed an oral bowel prep. This involves drinking a large amount of medicated liquid, starting the day before your procedure.  The liquid will cause you to have multiple loose stools until your stool is almost clear or light green. This cleans out your colon in preparation for the procedure.  Do not eat or drink anything else once you have started the bowel prep, unless your health care provider tells you it is safe to do so.  Arrange for someone to drive you home after the procedure. PROCEDURE   You will be given medicine to help you  relax (sedative).  You will lie on your side with your knees bent.  A long, flexible tube with a light and camera on the end (colonoscope) will be inserted through the rectum and into the colon. The camera sends video back to a computer screen as it moves through the colon. The colonoscope also releases carbon dioxide gas to inflate the colon. This helps your health care provider see the area better.  During the exam, your health care provider may take a small tissue sample (biopsy) to be examined under a microscope if any abnormalities are found.  The exam is finished when the entire colon has been viewed. AFTER THE PROCEDURE   Do not drive for 24 hours after the exam.  You may have a small amount of blood in your stool.  You may pass moderate amounts of gas and have mild abdominal cramping or bloating. This is caused by the gas used to inflate your colon during the exam.  Ask when your test results will be ready and how you will get your results. Make sure you get your test results.   This information is not intended to replace advice given to you by your health care provider. Make sure you discuss any questions you have with your health care provider.   Document Released: 06/24/2000 Document Revised: 04/17/2013 Document Reviewed: 03/04/2013 Elsevier Interactive Patient Education [employer].

## 2015-06-23 ENCOUNTER — Other Ambulatory Visit (HOSPITAL_COMMUNITY): Payer: Self-pay | Admitting: Internal Medicine

## 2015-06-23 ENCOUNTER — Encounter (HOSPITAL_COMMUNITY): Payer: Self-pay

## 2015-06-23 ENCOUNTER — Other Ambulatory Visit: Payer: Self-pay

## 2015-06-23 ENCOUNTER — Encounter (HOSPITAL_COMMUNITY)
Admission: RE | Admit: 2015-06-23 | Discharge: 2015-06-23 | Disposition: A | Discharge status: Home / Self Care | Payer: Medicare Other | Source: Ambulatory Visit | Attending: Internal Medicine | Admitting: Internal Medicine

## 2015-06-23 DIAGNOSIS — Z7901 Long term (current) use of anticoagulants: Secondary | ICD-10-CM | POA: Diagnosis not present

## 2015-06-23 DIAGNOSIS — Z1211 Encounter for screening for malignant neoplasm of colon: Secondary | ICD-10-CM | POA: Diagnosis not present

## 2015-06-23 DIAGNOSIS — K5900 Constipation, unspecified: Secondary | ICD-10-CM | POA: Diagnosis not present

## 2015-06-23 DIAGNOSIS — F329 Major depressive disorder, single episode, unspecified: Secondary | ICD-10-CM | POA: Diagnosis not present

## 2015-06-23 DIAGNOSIS — K644 Residual hemorrhoidal skin tags: Secondary | ICD-10-CM | POA: Diagnosis not present

## 2015-06-23 DIAGNOSIS — E7800 Pure hypercholesterolemia, unspecified: Secondary | ICD-10-CM | POA: Diagnosis not present

## 2015-06-23 DIAGNOSIS — Z86718 Personal history of other venous thrombosis and embolism: Secondary | ICD-10-CM | POA: Diagnosis not present

## 2015-06-23 DIAGNOSIS — K6389 Other specified diseases of intestine: Secondary | ICD-10-CM | POA: Diagnosis not present

## 2015-06-23 DIAGNOSIS — D123 Benign neoplasm of transverse colon: Secondary | ICD-10-CM | POA: Diagnosis not present

## 2015-06-23 DIAGNOSIS — R52 Pain, unspecified: Secondary | ICD-10-CM

## 2015-06-23 DIAGNOSIS — E039 Hypothyroidism, unspecified: Secondary | ICD-10-CM | POA: Diagnosis not present

## 2015-06-23 DIAGNOSIS — B1920 Unspecified viral hepatitis C without hepatic coma: Secondary | ICD-10-CM | POA: Diagnosis not present

## 2015-06-23 DIAGNOSIS — F419 Anxiety disorder, unspecified: Secondary | ICD-10-CM | POA: Diagnosis not present

## 2015-06-23 DIAGNOSIS — I739 Peripheral vascular disease, unspecified: Secondary | ICD-10-CM | POA: Diagnosis not present

## 2015-06-23 DIAGNOSIS — F17210 Nicotine dependence, cigarettes, uncomplicated: Secondary | ICD-10-CM | POA: Diagnosis not present

## 2015-06-23 HISTORY — DX: Major depressive disorder, single episode, unspecified: F32.9

## 2015-06-23 HISTORY — DX: Pure hypercholesterolemia, unspecified: E78.00

## 2015-06-23 HISTORY — DX: Constipation, unspecified: K59.00

## 2015-06-23 HISTORY — DX: Inflammatory liver disease, unspecified: K75.9

## 2015-06-23 HISTORY — DX: Hypothyroidism, unspecified: E03.9

## 2015-06-23 HISTORY — DX: Anxiety disorder, unspecified: F41.9

## 2015-06-23 HISTORY — DX: Depression, unspecified: F32.A

## 2015-06-23 LAB — CBC
HCT: 38.8 % — ABNORMAL LOW (ref 39.0–52.0)
Hemoglobin: 13.4 g/dL (ref 13.0–17.0)
MCH: 32.6 pg (ref 26.0–34.0)
MCHC: 34.5 g/dL (ref 30.0–36.0)
MCV: 94.4 fL (ref 78.0–100.0)
Platelets: 287 10*3/uL (ref 150–400)
RBC: 4.11 MIL/uL — ABNORMAL LOW (ref 4.22–5.81)
RDW: 12.5 % (ref 11.5–15.5)
WBC: 8.5 10*3/uL (ref 4.0–10.5)

## 2015-06-23 LAB — BASIC METABOLIC PANEL
Anion gap: 7 (ref 5–15)
BUN: 13 mg/dL (ref 6–20)
CO2: 30 mmol/L (ref 22–32)
Calcium: 9.4 mg/dL (ref 8.9–10.3)
Chloride: 99 mmol/L — ABNORMAL LOW (ref 101–111)
Creatinine, Ser: 0.89 mg/dL (ref 0.61–1.24)
GFR calc Af Amer: >60 mL/min (ref >60)
GFR calc non Af Amer: >60 mL/min (ref >60)
Glucose, Bld: 86 mg/dL (ref 65–99)
Potassium: 4.1 mmol/L (ref 3.5–5.1)
Sodium: 136 mmol/L (ref 135–145)

## 2015-06-26 ENCOUNTER — Ambulatory Visit (HOSPITAL_COMMUNITY): Payer: Medicare Other | Admitting: Anesthesiology

## 2015-06-26 ENCOUNTER — Encounter (HOSPITAL_COMMUNITY)
Admission: RE | Disposition: A | Discharge status: Home / Self Care | Payer: Self-pay | Source: Ambulatory Visit | Attending: Internal Medicine

## 2015-06-26 ENCOUNTER — Encounter (HOSPITAL_COMMUNITY): Payer: Self-pay | Admitting: *Deleted

## 2015-06-26 ENCOUNTER — Ambulatory Visit (HOSPITAL_COMMUNITY)
Admission: RE | Admit: 2015-06-26 | Discharge: 2015-06-26 | Disposition: A | Discharge status: Home / Self Care | Payer: Medicare Other | Source: Ambulatory Visit | Attending: Internal Medicine | Admitting: Internal Medicine

## 2015-06-26 DIAGNOSIS — F17210 Nicotine dependence, cigarettes, uncomplicated: Secondary | ICD-10-CM | POA: Insufficient documentation

## 2015-06-26 DIAGNOSIS — K5900 Constipation, unspecified: Secondary | ICD-10-CM | POA: Insufficient documentation

## 2015-06-26 DIAGNOSIS — Z7901 Long term (current) use of anticoagulants: Secondary | ICD-10-CM | POA: Insufficient documentation

## 2015-06-26 DIAGNOSIS — K6389 Other specified diseases of intestine: Secondary | ICD-10-CM

## 2015-06-26 DIAGNOSIS — F329 Major depressive disorder, single episode, unspecified: Secondary | ICD-10-CM | POA: Insufficient documentation

## 2015-06-26 DIAGNOSIS — B1920 Unspecified viral hepatitis C without hepatic coma: Secondary | ICD-10-CM | POA: Insufficient documentation

## 2015-06-26 DIAGNOSIS — D123 Benign neoplasm of transverse colon: Secondary | ICD-10-CM | POA: Insufficient documentation

## 2015-06-26 DIAGNOSIS — Z1211 Encounter for screening for malignant neoplasm of colon: Secondary | ICD-10-CM

## 2015-06-26 DIAGNOSIS — E7800 Pure hypercholesterolemia, unspecified: Secondary | ICD-10-CM | POA: Insufficient documentation

## 2015-06-26 DIAGNOSIS — I739 Peripheral vascular disease, unspecified: Secondary | ICD-10-CM | POA: Insufficient documentation

## 2015-06-26 DIAGNOSIS — F419 Anxiety disorder, unspecified: Secondary | ICD-10-CM | POA: Insufficient documentation

## 2015-06-26 DIAGNOSIS — K648 Other hemorrhoids: Secondary | ICD-10-CM | POA: Diagnosis not present

## 2015-06-26 DIAGNOSIS — E039 Hypothyroidism, unspecified: Secondary | ICD-10-CM | POA: Insufficient documentation

## 2015-06-26 DIAGNOSIS — Z86718 Personal history of other venous thrombosis and embolism: Secondary | ICD-10-CM | POA: Insufficient documentation

## 2015-06-26 DIAGNOSIS — K644 Residual hemorrhoidal skin tags: Secondary | ICD-10-CM | POA: Diagnosis not present

## 2015-06-26 HISTORY — PX: COLONOSCOPY WITH PROPOFOL: SHX5780

## 2015-06-26 HISTORY — PX: POLYPECTOMY: SHX5525

## 2015-06-26 SURGERY — COLONOSCOPY WITH PROPOFOL
Anesthesia: Monitor Anesthesia Care

## 2015-06-26 MED ORDER — ONDANSETRON HCL 4 MG/2ML IJ SOLN: 4.0000 mg | Freq: Once | INTRAMUSCULAR | Status: DC | PRN

## 2015-06-26 MED ORDER — MIDAZOLAM HCL 2 MG/2ML IJ SOLN
1.0000 mg | INTRAMUSCULAR | Status: DC | PRN
Start: 2015-06-26 — End: 2015-06-26
  Administered 2015-06-26: 2 mg via INTRAVENOUS
  Filled 2015-06-26: qty 2

## 2015-06-26 MED ORDER — LACTATED RINGERS IV SOLN
INTRAVENOUS | Status: DC
  Administered 2015-06-26: 11:00:00 via INTRAVENOUS

## 2015-06-26 MED ORDER — PROPOFOL 10 MG/ML IV BOLUS
INTRAVENOUS | Status: DC | PRN
  Administered 2015-06-26: 10 mg via INTRAVENOUS

## 2015-06-26 MED ORDER — FENTANYL CITRATE (PF) 100 MCG/2ML IJ SOLN: 25.0000 ug | INTRAMUSCULAR | Status: DC | PRN

## 2015-06-26 MED ORDER — BENEFIBER DRINK MIX PO PACK: 4.0000 g | PACK | Freq: Every day | ORAL | Status: DC

## 2015-06-26 MED ORDER — PROPOFOL 10 MG/ML IV BOLUS
INTRAVENOUS | Status: AC
  Filled 2015-06-26: qty 40

## 2015-06-26 MED ORDER — IPRATROPIUM-ALBUTEROL 0.5-2.5 (3) MG/3ML IN SOLN
3.0000 mL | Freq: Once | RESPIRATORY_TRACT | Status: AC
  Administered 2015-06-26: 3 mL via RESPIRATORY_TRACT
  Filled 2015-06-26: qty 3

## 2015-06-26 MED ORDER — DOCUSATE SODIUM 100 MG PO CAPS: 200.0000 mg | ORAL_CAPSULE | Freq: Every day | ORAL | Status: DC

## 2015-06-26 MED ORDER — PROPOFOL 500 MG/50ML IV EMUL
INTRAVENOUS | Status: DC | PRN
  Administered 2015-06-26: 125 ug/kg/min via INTRAVENOUS

## 2015-06-26 NOTE — H&P (Signed)
[name] is an 54 y.o. male.   Chief Complaint: Patient is here for colonoscopy. HPI: Patient is 54 year old Caucasian male who is here for screening colonoscopy. He denies abdominal pain rectal bleeding or melena. He has diminished constipation second to pain medications. He has phantom pains right leg. Family's is negative for CRC.  Past Medical History  Diagnosis Date  . History of DVT (deep vein thrombosis)   . Unspecified venous (peripheral) insufficiency   . Hepatitis     Hepatitis C  . Anxiety   . Depression   . Hypothyroidism   . Hypercholesterolemia   . Constipation     Past Surgical History  Procedure Laterality Date  . Back surgery    . Foot surgery Right   . Below knee leg amputation Right     History reviewed. No pertinent family history. Social History:  reports that he has been smoking.  He does not have any smokeless tobacco history on file. He reports that he does not drink alcohol or use illicit drugs.  Allergies: No Known Allergies  Medications Prior to Admission  Medication Sig Dispense Refill  . ALPRAZolam (XANAX) 1 MG tablet Take 1 mg by mouth 3 (three) times daily.    . fluticasone (FLONASE) 50 MCG/ACT nasal spray Place 2 sprays into the nose daily.    Marland Kitchen levothyroxine (SYNTHROID, LEVOTHROID) 25 MCG tablet Take 25 mcg by mouth daily before breakfast.    . montelukast (SINGULAIR) 10 MG tablet Take 10 mg by mouth at bedtime.    . Multiple Vitamin (MULTIVITAMIN) capsule Take 1 capsule by mouth daily.    Marland Kitchen omeprazole (PRILOSEC) 40 MG capsule Take 40 mg by mouth daily.    Marland Kitchen oxyCODONE (OXYCONTIN) 30 MG 12 hr tablet Take 30 mg by mouth 2 (two) times daily.    Marland Kitchen oxymorphone (OPANA ER) 20 MG 12 hr tablet Take 20 mg by mouth every 12 (twelve) hours.    . polyethylene glycol-electrolytes (NULYTELY/GOLYTELY) 420 G solution Take 4,000 mLs by mouth once. 4000 mL 0  . pravastatin (PRAVACHOL) 20 MG tablet Take 20 mg by mouth daily.    . QUEtiapine (SEROQUEL) 100  MG tablet Take 100 mg by mouth at bedtime.    . rivaroxaban (XARELTO) 20 MG TABS tablet Take 20 mg by mouth daily with supper.    Marland Kitchen albuterol (PROVENTIL HFA;VENTOLIN HFA) 108 (90 BASE) MCG/ACT inhaler Inhale 2 puffs into the lungs every 6 (six) hours as needed.    . Warfarin Sodium (COUMADIN PO) Take by mouth.      No results found for this or any previous visit (from the past 48 hour(s)). No results found.  ROS  Blood pressure 112/82, temperature 97.9 F (36.6 C), temperature source Oral, resp. rate 22, SpO2 99 %. Physical Exam  Constitutional: He appears well-developed and well-nourished.  HENT:  Mouth/Throat: Oropharynx is clear and moist.  Eyes: Conjunctivae are normal. No scleral icterus.  Neck: No thyromegaly present.  Cardiovascular: Normal rate, regular rhythm and normal heart sounds.   No murmur heard. Respiratory: Effort normal and breath sounds normal.  GI: Soft. He exhibits no distension and no mass. There is no tenderness.  Musculoskeletal:  No edema noted to left leg. 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  Lymphadenopathy:    He has no cervical adenopathy.  Neurological: He is alert.  Skin: Skin is warm and dry.     Assessment/Plan Average risk screening colonoscopy under monitored anesthesia care.  Byan Poplaski U 06/26/2015, 11:09  AM    

## 2015-06-26 NOTE — Anesthesia Postprocedure Evaluation (Signed)
Anesthesia Post Note  Patient: [name]  Procedure(s) Performed: Procedure(s) (LRB): COLONOSCOPY WITH PROPOFOL (N/A) POLYPECTOMY  Patient location during evaluation: PACU Anesthesia Type: MAC Level of consciousness: awake and alert and oriented Pain management: pain level controlled Vital Signs Assessment: post-procedure vital signs reviewed and stable Respiratory status: spontaneous breathing Cardiovascular status: blood pressure returned to baseline Anesthetic complications: no    Last Vitals:  Filed Vitals:   06/26/15 1230 06/26/15 1247  BP: 108/81 117/84  Pulse: 59 68  Temp:  36.5 C  Resp: 9 10    Last Pain:  Filed Vitals:   06/26/15 1251  PainSc: 5                  [name]

## 2015-06-26 NOTE — Op Note (Signed)
COLONOSCOPY PROCEDURE REPORT  PATIENT:  [name]  MR#:  [id number] Birthdate:  [birth year], 54 y.o., male Endoscopist:  Dr. Rogene Houston, MD Referred By:  Dr. Glo Herring, MD Procedure Date: 06/26/2015  Procedure:   Colonoscopy  Indications:  Patient is 54 year old Caucasian male was undergoing average risk screening colonoscopy.  Informed Consent:  The procedure and risks were reviewed with the patient and informed consent was obtained.  Medications:  Monitored anesthesia care. Please see anesthesia records for details.  Description of procedure:  After a digital rectal exam was performed, that colonoscope was advanced from the anus through the rectum and colon to the area of the cecum, ileocecal valve and appendiceal orifice. The cecum was deeply intubated. These structures were well-seen and photographed for the record. From the level of the cecum and ileocecal valve, the scope was slowly and cautiously withdrawn. The mucosal surfaces were carefully surveyed utilizing scope tip to flexion to facilitate fold flattening as needed. The scope was pulled down into the rectum where a thorough exam including retroflexion was performed.  Findings:   Prep satisfactory. Two small polyps ablated via cold biopsy from mid transverse colon and submitted together. Mild mucosal pigmentation involving distal half of the colon. Normal rectal mucosa. Small hemorrhoids below the dentate line.  Therapeutic/Diagnostic Maneuvers Performed:  See above  Complications:  None  EBL: Minimal  Cecal Withdrawal Time:  15  minutes  Impression:  Examination performed to cecum. Mild changes of melanosis coli. Two small polyps ablated via cold biopsy from mid transverse colon and submitted together. Small external hemorrhoids  Recommendations:  Standard instructions given. Colace 200 mg by mouth daily at bedtime. Benefiber 4 g by mouth daily at bedtime. I will contact patient with biopsy  results and further recommendations.  Jaivyn Gulla U  06/26/2015 12:07 PM  CC: Dr. Glo Herring., MD & Dr. Rayne Du ref. provider found

## 2015-06-26 NOTE — Addendum Note (Signed)
Addendum  created 06/26/15 1330 by Ollen Bowl, CRNA   Modules edited: Charges VN

## 2015-06-26 NOTE — Discharge Instructions (Signed)
Resume usual medications and high fiber diet. Benefiber 4 g by mouth daily at bedtime. Colace 200 mg by mouth daily at bedtime(OTC). No driving for 24 hours. Physician will call with biopsy results.  Colonoscopy, Care After Refer to this sheet in the next few weeks. These instructions provide you with information on caring for yourself after your procedure. Your health care provider may also give you more specific instructions. Your treatment has been planned according to current medical practices, but problems sometimes occur. Call your health care provider if you have any problems or questions after your procedure. WHAT TO EXPECT AFTER THE PROCEDURE  After your procedure, it is typical to have the following:  A small amount of blood in your stool.  Moderate amounts of gas and mild abdominal cramping or bloating. HOME CARE INSTRUCTIONS  Do not drive, operate machinery, or sign important documents for 24 hours.  You may shower and resume your regular physical activities, but move at a slower pace for the first 24 hours.  Take frequent rest periods for the first 24 hours.  Walk around or put a warm pack on your abdomen to help reduce abdominal cramping and bloating.  Drink enough fluids to keep your urine clear or pale yellow.  You may resume your normal diet as instructed by your health care provider. Avoid heavy or fried foods that are hard to digest.  Avoid drinking alcohol for 24 hours or as instructed by your health care provider.  Only take over-the-counter or prescription medicines as directed by your health care provider.  If a tissue sample (biopsy) was taken during your procedure:  Do not take aspirin or blood thinners for 7 days, or as instructed by your health care provider.  Do not drink alcohol for 7 days, or as instructed by your health care provider.  Eat soft foods for the first 24 hours. SEEK MEDICAL CARE IF: You have persistent spotting of blood in your stool  2-3 days after the procedure. SEEK IMMEDIATE MEDICAL CARE IF:  You have more than a small spotting of blood in your stool.  You pass large blood clots in your stool.  Your abdomen is swollen (distended).  You have nausea or vomiting.  You have a fever.  You have increasing abdominal pain that is not relieved with medicine.   This information is not intended to replace advice given to you by your health care provider. Make sure you discuss any questions you have with your health care provider.   Colon Polyps Polyps are lumps of extra tissue growing inside the body. Polyps can grow in the large intestine (colon). Most colon polyps are noncancerous (benign). However, some colon polyps can become cancerous over time. Polyps that are larger than a pea may be harmful. To be safe, caregivers remove and test all polyps. CAUSES  Polyps form when mutations in the genes cause your cells to grow and divide even though no more tissue is needed. RISK FACTORS There are a number of risk factors that can increase your chances of getting colon polyps. They include:  Being older than 50 years.  Family history of colon polyps or colon cancer.  Long-term colon diseases, such as colitis or Crohn disease.  Being overweight.  Smoking.  Being inactive.  Drinking too much alcohol. SYMPTOMS  Most small polyps do not cause symptoms. If symptoms are present, they may include:  Blood in the stool. The stool may look dark red or black.  Constipation or diarrhea that lasts  longer than 1 week. DIAGNOSIS People often do not know they have polyps until their caregiver finds them during a regular checkup. Your caregiver can use 4 tests to check for polyps:  Digital rectal exam. The caregiver wears gloves and feels inside the rectum. This test would find polyps only in the rectum.  Barium enema. The caregiver puts a liquid called barium into your rectum before taking X-rays of your colon. Barium makes  your colon look white. Polyps are dark, so they are easy to see in the X-ray pictures.  Sigmoidoscopy. A thin, flexible tube (sigmoidoscope) is placed into your rectum. The sigmoidoscope has a light and tiny camera in it. The caregiver uses the sigmoidoscope to look at the last third of your colon.  Colonoscopy. This test is like sigmoidoscopy, but the caregiver looks at the entire colon. This is the most common method for finding and removing polyps. TREATMENT  Any polyps will be removed during a sigmoidoscopy or colonoscopy. The polyps are then tested for cancer. PREVENTION  To help lower your risk of getting more colon polyps:  Eat plenty of fruits and vegetables. Avoid eating fatty foods.  Do not smoke.  Avoid drinking alcohol.  Exercise every day.  Lose weight if recommended by your caregiver.  Eat plenty of calcium and folate. Foods that are rich in calcium include milk, cheese, and broccoli. Foods that are rich in folate include chickpeas, kidney beans, and spinach. HOME CARE INSTRUCTIONS Keep all follow-up appointments as directed by your caregiver. You may need periodic exams to check for polyps. SEEK MEDICAL CARE IF: You notice bleeding during a bowel movement.   This information is not intended to replace advice given to you by your health care provider. Make sure you discuss any questions you have with your health care provider.   Document Released: 03/23/2004 Document Revised: 07/18/2014 Document Reviewed: 09/06/2011 Elsevier Interactive Patient Education [employer].

## 2015-06-26 NOTE — Transfer of Care (Signed)
Immediate Anesthesia Transfer of Care Note  Patient: [name]  Procedure(s) Performed: Procedure(s) with comments: COLONOSCOPY WITH PROPOFOL (N/A) - 1010 POLYPECTOMY - transverse colon polyps x2 (cold biopsy)  Patient Location: PACU  Anesthesia Type:MAC  Level of Consciousness: awake  Airway & Oxygen Therapy: Patient Spontanous Breathing  Post-op Assessment: Report given to RN  Post vital signs: Reviewed  Last Vitals:  Filed Vitals:   06/26/15 1100 06/26/15 1105  BP: 104/77 112/82  Temp:    Resp: 20 22    Complications: No apparent anesthesia complications

## 2015-06-26 NOTE — Anesthesia Preprocedure Evaluation (Signed)
Anesthesia Evaluation  Patient identified by MRN, date of birth, ID band Patient awake    Reviewed: Allergy & Precautions, NPO status , Patient's Chart, lab work & pertinent test results  Airway Mallampati: II   Neck ROM: Full    Dental  (+) Missing, Poor Dentition, Chipped,    Pulmonary Current Smoker,    Pulmonary exam normal        Cardiovascular + Peripheral Vascular Disease and + DVT  Normal cardiovascular exam     Neuro/Psych Anxiety Depression    GI/Hepatic (+) Hepatitis -, C  Endo/Other  Hypothyroidism   Renal/GU      Musculoskeletal   Abdominal Normal abdominal exam  (+)   Peds  Hematology   Anesthesia Other Findings   Reproductive/Obstetrics                             Anesthesia Physical Anesthesia Plan  ASA: III  Anesthesia Plan: MAC   Post-op Pain Management:    Induction:   Airway Management Planned: Nasal Cannula  Additional Equipment:   Intra-op Plan:   Post-operative Plan:   Informed Consent: I have reviewed the patients History and Physical, chart, labs and discussed the procedure including the risks, benefits and alternatives for the proposed anesthesia with the patient or authorized representative who has indicated his/her understanding and acceptance.   Dental advisory given  Plan Discussed with: CRNA  Anesthesia Plan Comments:         Anesthesia Quick Evaluation

## 2015-07-02 ENCOUNTER — Encounter (HOSPITAL_COMMUNITY): Payer: Self-pay | Admitting: Internal Medicine

## 2015-07-09 ENCOUNTER — Ambulatory Visit (HOSPITAL_COMMUNITY)
Admission: RE | Admit: 2015-07-09 | Discharge: 2015-07-09 | Disposition: A | Discharge status: Home / Self Care | Payer: Medicare Other | Source: Ambulatory Visit | Attending: Internal Medicine | Admitting: Internal Medicine

## 2015-07-09 DIAGNOSIS — G8929 Other chronic pain: Secondary | ICD-10-CM | POA: Diagnosis not present

## 2015-07-09 DIAGNOSIS — R52 Pain, unspecified: Secondary | ICD-10-CM

## 2015-07-09 DIAGNOSIS — M25562 Pain in left knee: Secondary | ICD-10-CM | POA: Diagnosis not present

## 2015-07-09 DIAGNOSIS — R937 Abnormal findings on diagnostic imaging of other parts of musculoskeletal system: Secondary | ICD-10-CM | POA: Diagnosis not present

## 2015-07-09 DIAGNOSIS — M25462 Effusion, left knee: Secondary | ICD-10-CM | POA: Insufficient documentation

## 2015-07-16 ENCOUNTER — Encounter (INDEPENDENT_AMBULATORY_CARE_PROVIDER_SITE_OTHER): Payer: Self-pay | Admitting: Internal Medicine

## 2015-07-16 ENCOUNTER — Ambulatory Visit (INDEPENDENT_AMBULATORY_CARE_PROVIDER_SITE_OTHER): Payer: Medicare Other | Admitting: Internal Medicine

## 2015-07-16 VITALS — BP 108/84 | HR 94 | Temp 98.0°F | Ht 68.0 in | Wt 206.8 lb

## 2015-07-16 DIAGNOSIS — E039 Hypothyroidism, unspecified: Secondary | ICD-10-CM | POA: Insufficient documentation

## 2015-07-16 DIAGNOSIS — B1920 Unspecified viral hepatitis C without hepatic coma: Secondary | ICD-10-CM | POA: Diagnosis not present

## 2015-07-16 DIAGNOSIS — K5909 Other constipation: Secondary | ICD-10-CM | POA: Diagnosis not present

## 2015-07-16 DIAGNOSIS — E7800 Pure hypercholesterolemia, unspecified: Secondary | ICD-10-CM | POA: Insufficient documentation

## 2015-07-16 DIAGNOSIS — I82409 Acute embolism and thrombosis of unspecified deep veins of unspecified lower extremity: Secondary | ICD-10-CM | POA: Insufficient documentation

## 2015-07-16 MED ORDER — LUBIPROSTONE 24 MCG PO CAPS: 24.0000 ug | ORAL_CAPSULE | Freq: Two times a day (BID) | ORAL | Status: DC
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
## 2015-07-16 NOTE — Patient Instructions (Signed)
Labs today. OV in 3 months.  

## 2015-07-17 LAB — CBC WITH DIFFERENTIAL/PLATELET
Basophils Absolute: 0 10*3/uL (ref 0.0–0.1)
Basophils Relative: 0 % (ref 0–1)
Eosinophils Absolute: 0.1 10*3/uL (ref 0.0–0.7)
Eosinophils Relative: 1 % (ref 0–5)
HCT: 41.2 % (ref 39.0–52.0)
Hemoglobin: 13.9 g/dL (ref 13.0–17.0)
Lymphocytes Relative: 39 % (ref 12–46)
Lymphs Abs: 3.2 10*3/uL (ref 0.7–4.0)
MCH: 31.7 pg (ref 26.0–34.0)
MCHC: 33.7 g/dL (ref 30.0–36.0)
MCV: 94.1 fL (ref 78.0–100.0)
MPV: 10.7 fL (ref 8.6–12.4)
Monocytes Absolute: 0.6 10*3/uL (ref 0.1–1.0)
Monocytes Relative: 7 % (ref 3–12)
Neutro Abs: 4.4 10*3/uL (ref 1.7–7.7)
Neutrophils Relative %: 53 % (ref 43–77)
Platelets: 237 10*3/uL (ref 150–400)
RBC: 4.38 MIL/uL (ref 4.22–5.81)
RDW: 13.8 % (ref 11.5–15.5)
WBC: 8.3 10*3/uL (ref 4.0–10.5)

## 2015-07-17 LAB — PROTIME-INR
INR: 1.07 (ref <1.50)
Prothrombin Time: 14 seconds (ref 11.6–15.2)

## 2015-07-17 LAB — DRUG SCREEN, URINE
Amphetamine Screen, Ur: NEGATIVE
Barbiturate Quant, Ur: NEGATIVE
Benzodiazepines.: POSITIVE — AB
Cocaine Metabolites: NEGATIVE
Creatinine,U: 45.9 mg/dL
Marijuana Metabolite: NEGATIVE
Methadone: NEGATIVE
Opiates: POSITIVE — AB
Phencyclidine (PCP): NEGATIVE
Propoxyphene: NEGATIVE

## 2015-07-17 LAB — HEPATIC FUNCTION PANEL
ALT: 14 U/L (ref 9–46)
AST: 17 U/L (ref 10–35)
Albumin: 4 g/dL (ref 3.6–5.1)
Alkaline Phosphatase: 96 U/L (ref 40–115)
Bilirubin, Direct: 0.1 mg/dL (ref <=0.2)
Indirect Bilirubin: 0.3 mg/dL (ref 0.2–1.2)
Total Bilirubin: 0.4 mg/dL (ref 0.2–1.2)
Total Protein: 7.7 g/dL (ref 6.1–8.1)

## 2015-07-17 LAB — AFP TUMOR MARKER: AFP-Tumor Marker: 1 ng/mL (ref <6.1)

## 2015-07-17 LAB — HEPATITIS C ANTIBODY: HCV Ab: REACTIVE — AB

## 2015-07-21 ENCOUNTER — Telehealth (INDEPENDENT_AMBULATORY_CARE_PROVIDER_SITE_OTHER): Payer: Self-pay | Admitting: Internal Medicine

## 2015-07-21 DIAGNOSIS — B1920 Unspecified viral hepatitis C without hepatic coma: Secondary | ICD-10-CM

## 2015-07-21 LAB — HEPATITIS C RNA QUANTITATIVE: HCV Quantitative: NOT DETECTED IU/mL (ref <15)

## 2015-07-21 NOTE — Telephone Encounter (Signed)
US Abdomen for surveillance

## 2015-07-21 NOTE — Telephone Encounter (Signed)
Results given to patient

## 2015-07-22 LAB — HEPATITIS C GENOTYPE

## 2015-07-28 ENCOUNTER — Ambulatory Visit (HOSPITAL_COMMUNITY): Payer: Medicare Other

## 2015-08-06 ENCOUNTER — Ambulatory Visit (HOSPITAL_COMMUNITY)
Admission: RE | Admit: 2015-08-06 | Discharge: 2015-08-06 | Disposition: A | Discharge status: Home / Self Care | Payer: Medicare Other | Source: Ambulatory Visit | Attending: Internal Medicine | Admitting: Internal Medicine

## 2015-08-06 DIAGNOSIS — Z9049 Acquired absence of other specified parts of digestive tract: Secondary | ICD-10-CM | POA: Insufficient documentation

## 2015-08-06 DIAGNOSIS — B1920 Unspecified viral hepatitis C without hepatic coma: Secondary | ICD-10-CM | POA: Diagnosis not present

## 2015-08-06 DIAGNOSIS — N200 Calculus of kidney: Secondary | ICD-10-CM | POA: Insufficient documentation

## 2015-08-06 DIAGNOSIS — R932 Abnormal findings on diagnostic imaging of liver and biliary tract: Secondary | ICD-10-CM | POA: Diagnosis not present

## 2015-08-13 ENCOUNTER — Telehealth (INDEPENDENT_AMBULATORY_CARE_PROVIDER_SITE_OTHER): Payer: Self-pay | Admitting: Internal Medicine

## 2015-08-13 NOTE — Telephone Encounter (Signed)
Please call him after 10am

## 2015-08-13 NOTE — Telephone Encounter (Signed)
[name] left a message saying he's still waiting to receive the results of the Ultra Sound he had last week. He'd like a phone call regarding this. He'd also like a printed letter saying his Hepatitis C isn't contagious so he can show it to the person he's having sexual intercourse with. This information he also left on the message. Please call the patient.  Pt's ph# [phone]  Thank you.

## 2015-08-17 NOTE — Telephone Encounter (Signed)
Results of [place] given to patient again. I advised him I could not write him a note concerning his Hepatitis C

## 2015-10-05 ENCOUNTER — Telehealth (INDEPENDENT_AMBULATORY_CARE_PROVIDER_SITE_OTHER): Payer: Self-pay | Admitting: Internal Medicine

## 2015-10-05 NOTE — Telephone Encounter (Signed)
May try taking Amitiza twice a day. May consider stopping pain medication. May consider as needed enema.

## 2015-10-05 NOTE — Telephone Encounter (Signed)
Mr. [name] called saying his constipation medication doesn't work and he's feeling terrible all the time. He was able to have a bowel movement on Friday afterwards he felt weak. He's wondering if a stronger medication can be prescribed or what he can do regarding this. Please call the pt.   Pt's ph# [phone]  Thank you.

## 2015-10-14 ENCOUNTER — Ambulatory Visit (INDEPENDENT_AMBULATORY_CARE_PROVIDER_SITE_OTHER): Payer: Medicare Other | Admitting: Internal Medicine

## 2016-01-13 ENCOUNTER — Ambulatory Visit (INDEPENDENT_AMBULATORY_CARE_PROVIDER_SITE_OTHER): Payer: Medicare Other | Admitting: Internal Medicine

## 2016-12-21 ENCOUNTER — Ambulatory Visit (INDEPENDENT_AMBULATORY_CARE_PROVIDER_SITE_OTHER): Payer: Medicare Other | Admitting: Family Medicine

## 2016-12-21 ENCOUNTER — Encounter (INDEPENDENT_AMBULATORY_CARE_PROVIDER_SITE_OTHER): Payer: Self-pay

## 2016-12-21 ENCOUNTER — Encounter: Payer: Self-pay | Admitting: Family Medicine

## 2016-12-21 VITALS — BP 89/62 | HR 86 | Temp 97.1°F | Ht 68.0 in | Wt 200.0 lb

## 2016-12-21 DIAGNOSIS — G546 Phantom limb syndrome with pain: Secondary | ICD-10-CM | POA: Insufficient documentation

## 2016-12-21 DIAGNOSIS — F418 Other specified anxiety disorders: Secondary | ICD-10-CM

## 2016-12-21 DIAGNOSIS — T402X5A Adverse effect of other opioids, initial encounter: Secondary | ICD-10-CM | POA: Diagnosis not present

## 2016-12-21 DIAGNOSIS — Z89511 Acquired absence of right leg below knee: Secondary | ICD-10-CM | POA: Diagnosis not present

## 2016-12-21 DIAGNOSIS — G959 Disease of spinal cord, unspecified: Secondary | ICD-10-CM

## 2016-12-21 DIAGNOSIS — I82401 Acute embolism and thrombosis of unspecified deep veins of right lower extremity: Secondary | ICD-10-CM

## 2016-12-21 DIAGNOSIS — N529 Male erectile dysfunction, unspecified: Secondary | ICD-10-CM | POA: Diagnosis not present

## 2016-12-21 DIAGNOSIS — F11288 Opioid dependence with other opioid-induced disorder: Secondary | ICD-10-CM

## 2016-12-21 DIAGNOSIS — E7800 Pure hypercholesterolemia, unspecified: Secondary | ICD-10-CM

## 2016-12-21 DIAGNOSIS — K5903 Drug induced constipation: Secondary | ICD-10-CM

## 2016-12-21 DIAGNOSIS — Z0000 Encounter for general adult medical examination without abnormal findings: Secondary | ICD-10-CM

## 2016-12-21 DIAGNOSIS — F1120 Opioid dependence, uncomplicated: Secondary | ICD-10-CM | POA: Insufficient documentation

## 2016-12-21 DIAGNOSIS — E039 Hypothyroidism, unspecified: Secondary | ICD-10-CM | POA: Diagnosis not present

## 2016-12-21 LAB — URINALYSIS
Bilirubin, UA: NEGATIVE
Glucose, UA: NEGATIVE
Ketones, UA: NEGATIVE
Leukocytes, UA: NEGATIVE
Nitrite, UA: NEGATIVE
Protein, UA: NEGATIVE
Specific Gravity, UA: <1.005 — ABNORMAL LOW (ref 1.005–1.030)
Urobilinogen, Ur: 0.2 mg/dL (ref 0.2–1.0)
pH, UA: 6 (ref 5.0–7.5)

## 2016-12-21 MED ORDER — LINACLOTIDE 290 MCG PO CAPS: 290.0000 ug | ORAL_CAPSULE | Freq: Every day | ORAL | 3 refills | Status: DC

## 2016-12-21 MED ORDER — SILDENAFIL CITRATE 20 MG PO TABS: 20.0000 mg | ORAL_TABLET | Freq: Every day | ORAL | 5 refills | Status: DC | PRN
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
## 2016-12-22 ENCOUNTER — Other Ambulatory Visit: Payer: Self-pay | Admitting: *Deleted

## 2016-12-22 ENCOUNTER — Other Ambulatory Visit: Payer: Self-pay | Admitting: Family Medicine

## 2016-12-22 DIAGNOSIS — E038 Other specified hypothyroidism: Secondary | ICD-10-CM

## 2016-12-22 LAB — CBC WITH DIFFERENTIAL/PLATELET
Basophils Absolute: 0 10*3/uL (ref 0.0–0.2)
Basos: 0 %
EOS (ABSOLUTE): 0.1 10*3/uL (ref 0.0–0.4)
Eos: 1 %
Hematocrit: 41.7 % (ref 37.5–51.0)
Hemoglobin: 14.1 g/dL (ref 13.0–17.7)
Immature Grans (Abs): 0 10*3/uL (ref 0.0–0.1)
Immature Granulocytes: 0 %
Lymphocytes Absolute: 3.7 10*3/uL — ABNORMAL HIGH (ref 0.7–3.1)
Lymphs: 51 %
MCH: 31.8 pg (ref 26.6–33.0)
MCHC: 33.8 g/dL (ref 31.5–35.7)
MCV: 94 fL (ref 79–97)
Monocytes Absolute: 0.6 10*3/uL (ref 0.1–0.9)
Monocytes: 9 %
Neutrophils Absolute: 2.7 10*3/uL (ref 1.4–7.0)
Neutrophils: 39 %
Platelets: 211 10*3/uL (ref 150–379)
RBC: 4.44 x10E6/uL (ref 4.14–5.80)
RDW: 13.3 % (ref 12.3–15.4)
WBC: 7.1 10*3/uL (ref 3.4–10.8)

## 2016-12-22 LAB — CMP14+EGFR
ALT: 18 IU/L (ref 0–44)
AST: 17 IU/L (ref 0–40)
Albumin/Globulin Ratio: 1.2 (ref 1.2–2.2)
Albumin: 4.1 g/dL (ref 3.5–5.5)
Alkaline Phosphatase: 84 IU/L (ref 39–117)
BUN/Creatinine Ratio: 13 (ref 9–20)
BUN: 13 mg/dL (ref 6–24)
Bilirubin Total: 0.3 mg/dL (ref 0.0–1.2)
CO2: 28 mmol/L (ref 20–29)
Calcium: 9.2 mg/dL (ref 8.7–10.2)
Chloride: 101 mmol/L (ref 96–106)
Creatinine, Ser: 0.97 mg/dL (ref 0.76–1.27)
GFR calc Af Amer: 101 mL/min/{1.73_m2} (ref >59)
GFR calc non Af Amer: 88 mL/min/{1.73_m2} (ref >59)
Globulin, Total: 3.3 g/dL (ref 1.5–4.5)
Glucose: 80 mg/dL (ref 65–99)
Potassium: 5.1 mmol/L (ref 3.5–5.2)
Sodium: 139 mmol/L (ref 134–144)
Total Protein: 7.4 g/dL (ref 6.0–8.5)

## 2016-12-22 LAB — LIPID PANEL
Chol/HDL Ratio: 4.6 ratio (ref 0.0–5.0)
Cholesterol, Total: 176 mg/dL (ref 100–199)
HDL: 38 mg/dL — ABNORMAL LOW (ref >39)
LDL Calculated: 112 mg/dL — ABNORMAL HIGH (ref 0–99)
Triglycerides: 128 mg/dL (ref 0–149)
VLDL Cholesterol Cal: 26 mg/dL (ref 5–40)

## 2016-12-22 LAB — PSA TOTAL (REFLEX TO FREE): Prostate Specific Ag, Serum: 0.4 ng/mL (ref 0.0–4.0)

## 2016-12-22 LAB — TSH+FREE T4
Free T4: 1.44 ng/dL (ref 0.82–1.77)
TSH: 7.15 u[IU]/mL — ABNORMAL HIGH (ref 0.450–4.500)

## 2016-12-22 MED ORDER — LEVOTHYROXINE SODIUM 25 MCG PO TABS: 25.0000 ug | ORAL_TABLET | Freq: Every day | ORAL | 2 refills | Status: DC

## 2017-01-12 ENCOUNTER — Telehealth: Payer: Self-pay

## 2017-01-12 NOTE — Telephone Encounter (Signed)
Have not gotten into a pain clinic yet and will run out of meds next week  Referrals working on pain clinic  Can you give me meds till I get in?

## 2017-01-13 ENCOUNTER — Ambulatory Visit: Payer: Medicare Other | Admitting: Family Medicine

## 2017-01-13 NOTE — Telephone Encounter (Signed)
appt made for this afternoon.

## 2017-01-13 NOTE — Telephone Encounter (Signed)
He would need to be seen

## 2017-01-16 ENCOUNTER — Telehealth: Payer: Self-pay | Admitting: *Deleted

## 2017-01-16 NOTE — Telephone Encounter (Signed)
Left message for patient to call back.  He will need to reschedule appointment with pcp Dr. Livia Snellen for pain medication.  He will also need a 30 min appt to discuss pain medication.  This medication has never been prescribed at this office.  Per Dr. Evette Doffing.

## 2017-01-19 ENCOUNTER — Ambulatory Visit: Payer: Medicare Other | Admitting: Pediatrics

## 2017-01-20 ENCOUNTER — Encounter: Payer: Self-pay | Admitting: Family Medicine

## 2017-01-25 ENCOUNTER — Telehealth: Payer: Self-pay | Admitting: Family Medicine

## 2017-01-25 NOTE — Telephone Encounter (Signed)
Voided letter since this was our mistake

## 2017-01-27 ENCOUNTER — Encounter: Payer: Medicare Other | Admitting: Family Medicine

## 2017-02-01 ENCOUNTER — Encounter: Payer: Self-pay | Admitting: Family Medicine

## 2017-02-01 ENCOUNTER — Ambulatory Visit (INDEPENDENT_AMBULATORY_CARE_PROVIDER_SITE_OTHER): Payer: Medicare Other | Admitting: Family Medicine

## 2017-02-01 VITALS — BP 107/79 | HR 115 | Temp 98.2°F | Ht 68.0 in | Wt 195.0 lb

## 2017-02-01 DIAGNOSIS — E038 Other specified hypothyroidism: Secondary | ICD-10-CM

## 2017-02-01 DIAGNOSIS — G959 Disease of spinal cord, unspecified: Secondary | ICD-10-CM | POA: Diagnosis not present

## 2017-02-01 DIAGNOSIS — F11288 Opioid dependence with other opioid-induced disorder: Secondary | ICD-10-CM | POA: Diagnosis not present

## 2017-02-01 DIAGNOSIS — Z89511 Acquired absence of right leg below knee: Secondary | ICD-10-CM

## 2017-02-01 DIAGNOSIS — G8929 Other chronic pain: Secondary | ICD-10-CM

## 2017-02-01 MED ORDER — OXYCODONE HCL 30 MG PO TABA: 30.0000 mg | ORAL_TABLET | ORAL | 0 refills | Status: DC | PRN

## 2017-02-01 NOTE — Progress Notes (Signed)
Subjective:  Patient ID: [name], male    DOB: [birth year]  Age: 56 y.o. MRN: [id number]  CC: Pain (pt here today for pain management)   HPI [name] presents for Patient has phantom pain from his right BKA. He also has a lumbar myelopathy for which she takes opiates. He is currently taking oxycodone 30 mg 6 times daily. He is not being seen pain clinic due to the clinic closing and inability to get to his new appointment just yet. Pain runs 7-8/10 when he misses his medicine. He took his last pill yesterday and it is currently at an 8/10. Reduces to 2-3/10 when taking his medicine on schedule. Morphine milligram equivalent is 270. He is no longer taking the oxymorphone.  Patient presents for follow-up on  thyroid. The patient has a history of hypothyroidism recently his dose had to be changed. He is due for a recheck based on that change.. Pt. denies any change in  voice, loss of hair, heat or cold intolerance. Energy level has been adequate to good. Patient denies constipation and diarrhea. No myxedema. Medication is as noted below. Verified that pt is taking it daily on an empty stomach. Well tolerated.  Depression screen [facility] 2/9 02/01/2017 12/21/2016  Decreased Interest 0 0  Down, Depressed, Hopeless 0 0  PHQ - 2 Score 0 0    History Torre has a past medical history of Anxiety; Constipation; Depression; Hepatitis; History of DVT (deep vein thrombosis); Hypercholesterolemia; Hypothyroidism; and Unspecified venous (peripheral) insufficiency.   He has a past surgical history that includes Back surgery; Foot surgery (Right); Below knee leg amputation (Right); Colonoscopy with propofol (N/A, 06/26/2015); and polypectomy (06/26/2015).   His family history includes Arthritis in his brother; Cancer in his brother and father; Depression in his brother; Mental illness in his brother.He reports that he has been smoking.  He has a 19.00 pack-year smoking history. He has never used smokeless  tobacco. He reports that he does not drink alcohol or use drugs.    ROS Review of Systems  Objective:  BP 107/79   Pulse (!) 115   Temp 98.2 F (36.8 C) (Oral)   Ht 5\' 8"  (1.727 m)   Wt 195 lb (88.5 kg)   BMI 29.65 kg/m   BP Readings from Last 3 Encounters:  02/01/17 107/79  12/21/16 (!) 89/62  07/16/15 108/84    Wt Readings from Last 3 Encounters:  02/01/17 195 lb (88.5 kg)  12/21/16 200 lb (90.7 kg)  07/16/15 206 lb 12.8 oz (93.8 kg)     Physical Exam    Assessment & Plan:   Christophe was seen today for pain.  Diagnoses and all orders for this visit:  Encounter for chronic pain management -     ToxASSURE Select 13 (MW), Urine  Other specified hypothyroidism -     TSH + free T4  Opioid dependence with other opioid-induced disorder (HCC)  Lumbar myelopathy (HCC)  Hx of right BKA ([place])  Other orders -     OxyCODONE HCl 30 MG TABA; Take 30 mg by mouth every 3 (three) hours as needed (severe pain).       I have discontinued Mr. Barella's oxymorphone and oxyCODONE. I am also having him start on OxyCODONE HCl. Additionally, I am having him maintain his pravastatin, ALPRAZolam, QUEtiapine, rivaroxaban, docusate sodium, co-enzyme Q-10, multivitamin, B Complex-C (SUPER B COMPLEX PO), lubiprostone, sildenafil, linaclotide, and levothyroxine.  Allergies as of 02/01/2017   No Known Allergies  Medication List       Accurate as of 02/01/17 11:59 PM. Always use your most recent med list.          ALPRAZolam 1 MG tablet Commonly known as:  XANAX Take 1 mg by mouth 3 (three) times daily.   co-enzyme Q-10 30 MG capsule Take 30 mg by mouth daily.   docusate sodium 100 MG capsule Commonly known as:  COLACE Take 2 capsules (200 mg total) by mouth daily.   levothyroxine 25 MCG tablet Commonly known as:  SYNTHROID, LEVOTHROID Take 1 tablet (25 mcg total) by mouth daily before breakfast.   linaclotide 290 MCG Caps capsule Commonly known as:  LINZESS Take  1 capsule (290 mcg total) by mouth daily. To regulate bowel movements   lubiprostone 24 MCG capsule Commonly known as:  AMITIZA Take 1 capsule (24 mcg total) by mouth 2 (two) times daily with a meal.   multivitamin tablet Take 1 tablet by mouth daily.   OxyCODONE HCl 30 MG Taba Take 30 mg by mouth every 3 (three) hours as needed (severe pain).   pravastatin 20 MG tablet Commonly known as:  PRAVACHOL Take 20 mg by mouth daily.   QUEtiapine 100 MG tablet Commonly known as:  SEROQUEL Take 100 mg by mouth at bedtime.   rivaroxaban 20 MG Tabs tablet Commonly known as:  XARELTO Take 20 mg by mouth daily with supper.   sildenafil 20 MG tablet Commonly known as:  REVATIO Take 1 tablet (20 mg total) by mouth daily as needed (2-5 as needed).   SUPER B COMPLEX PO Take by mouth.      Pain clinic referral has been made. Complications are based on closure of comprehensive pain Institute in [place] and [place]. Referral is pending. I will follow until referral can be confirmed. Due to the transient nature of this arrangement I did not complete a written pain management agreement today. However if we can get him in in the next 30 days he will follow-up here and it will be done at that time. In the meantime he was verbally informed of expectations including that he did not share medicine and lost or stolen medicines could not be replaced. He cannot get medications from anywhere but here in this timeframe.   Follow-up: No Follow-up on file.  Claretta Fraise, M.D.

## 2017-02-02 LAB — TSH+FREE T4
Free T4: 1.03 ng/dL (ref 0.82–1.77)
TSH: 1.39 u[IU]/mL (ref 0.450–4.500)

## 2017-02-07 LAB — TOXASSURE SELECT 13 (MW), URINE

## 2017-02-22 IMAGING — US US ABDOMEN COMPLETE
1 series · 14 of 25 positions shown · non-contrast
Comparison: Ultrasound 05/07/2011.

CLINICAL DATA: Hepatitis-C.

EXAM:
ABDOMEN ULTRASOUND COMPLETE

[Series 1: us abdomen complete · 0.27mm/px · 14 of 77 slices shown]
[im 1/77]
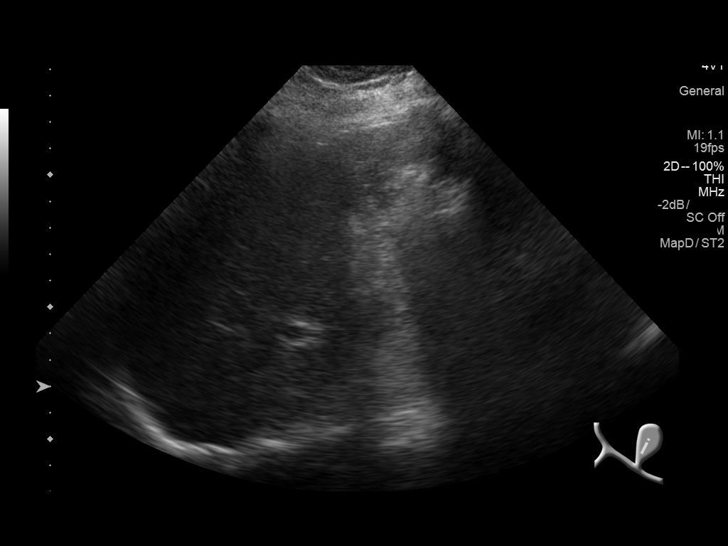
[im 7/77]
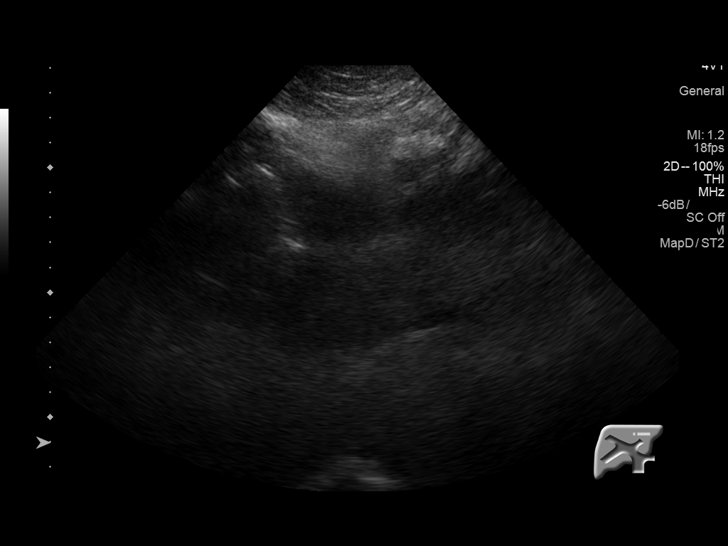
[im 13/77]
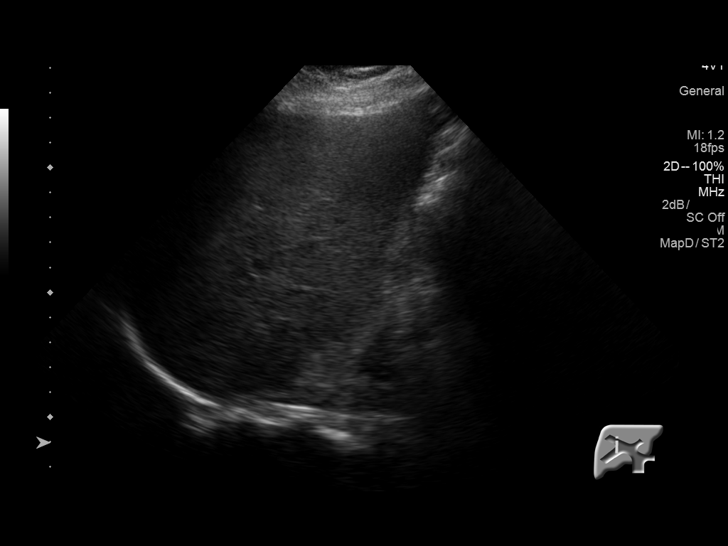
[im 20/77]
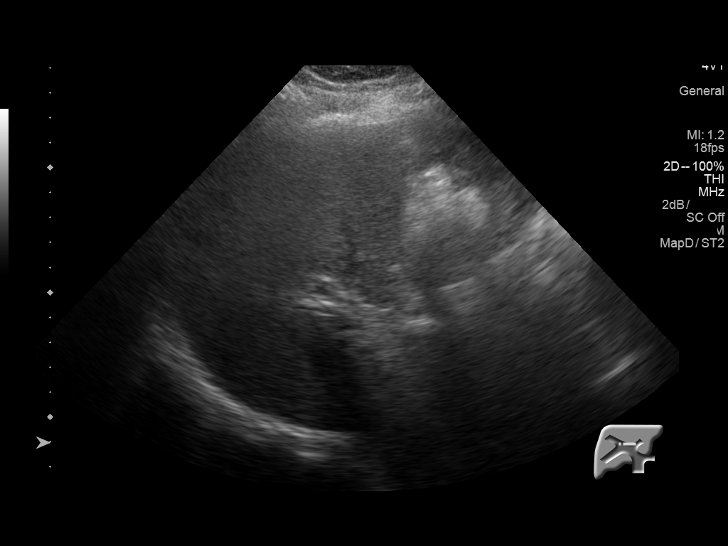
[im 26/77]
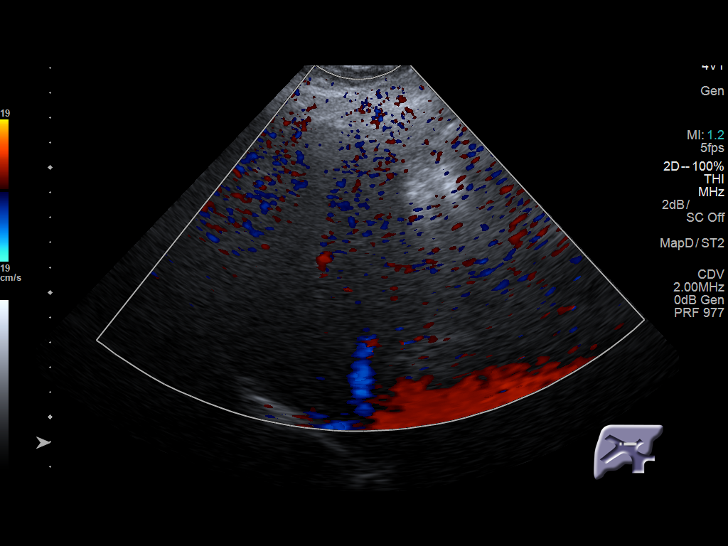
[im 29/77]
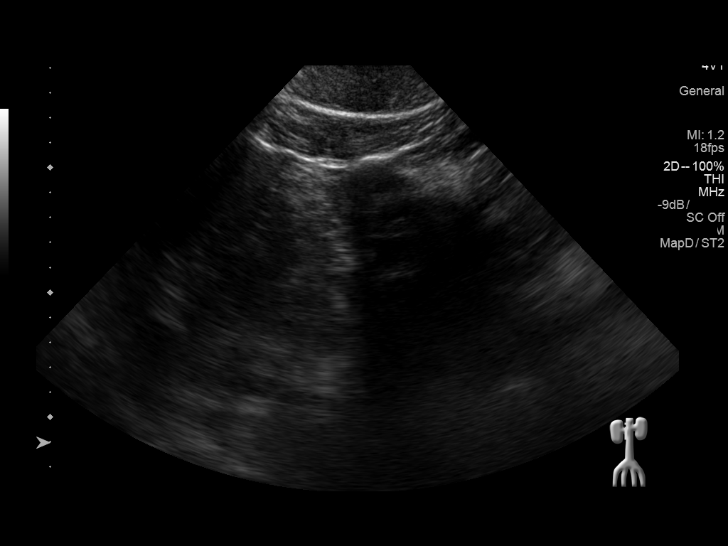
[im 35/77]
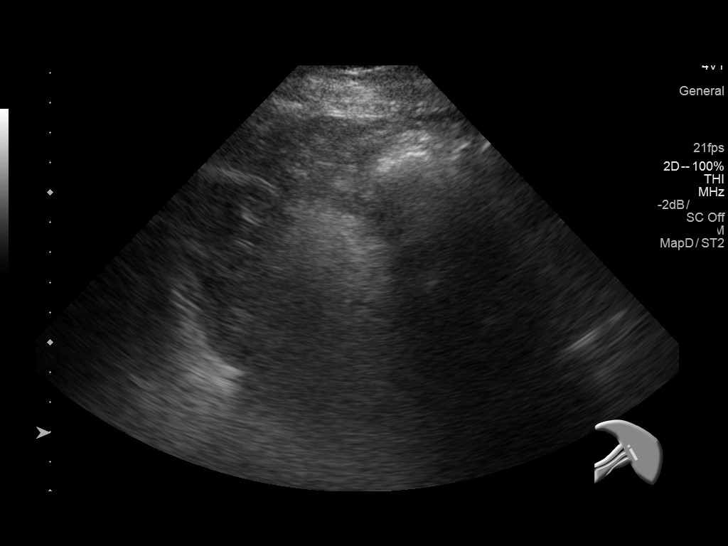
[im 42/77]
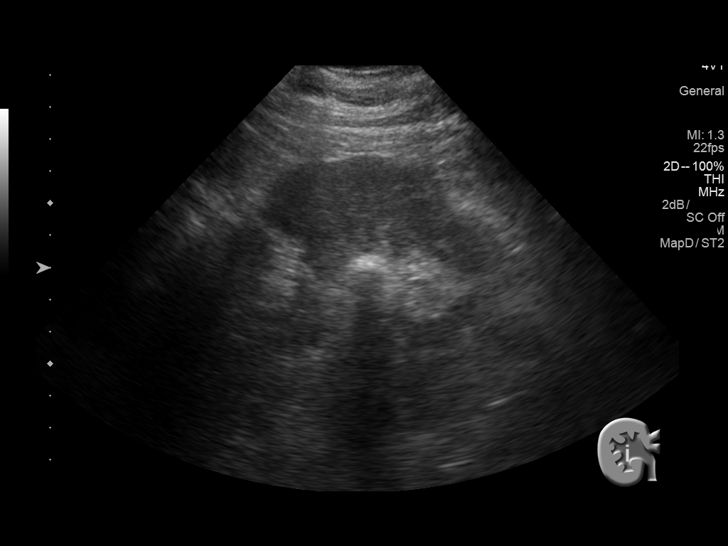
[im 48/77]
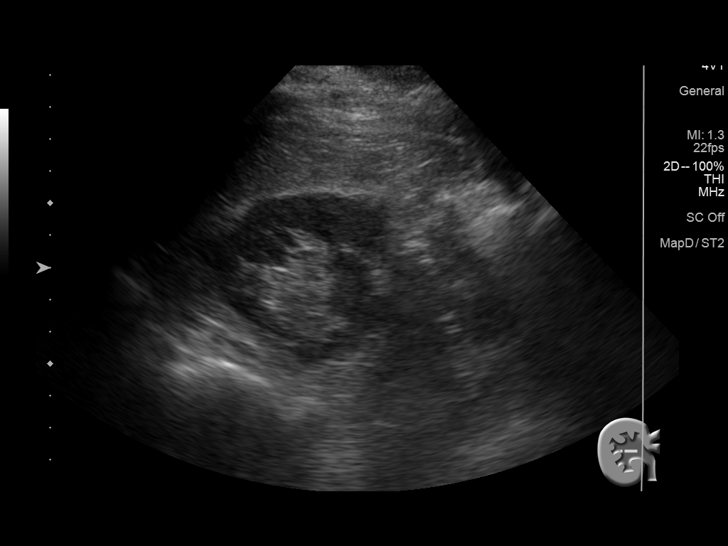
[im 51/77]
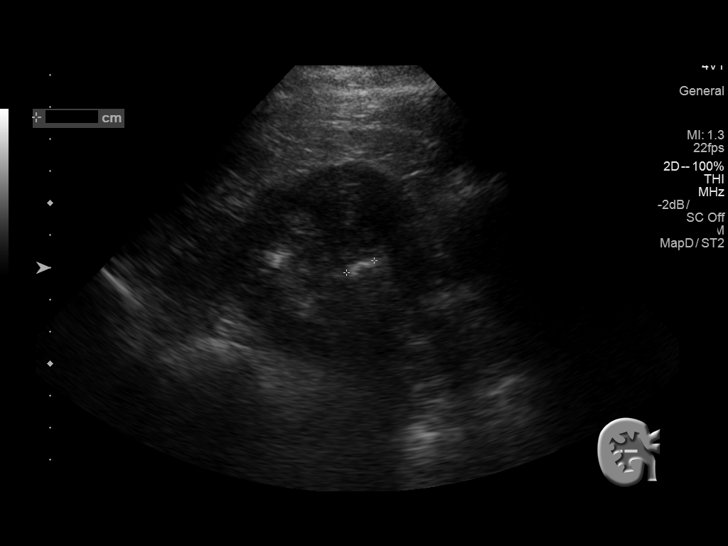
[im 58/77]
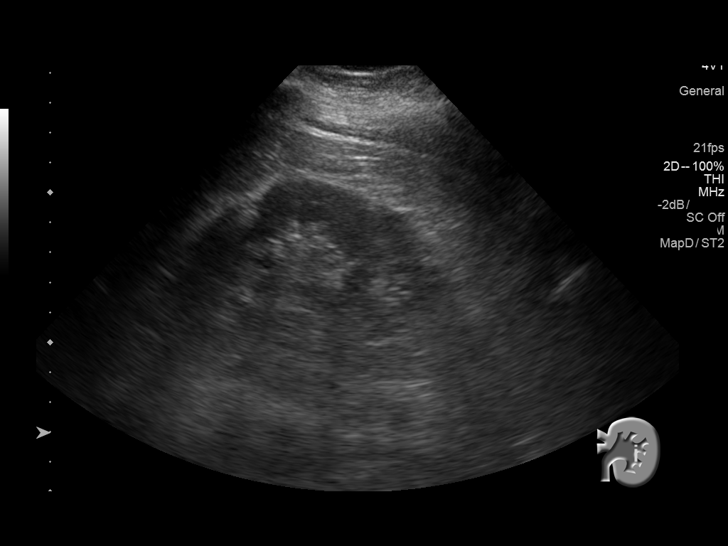
[im 64/77]
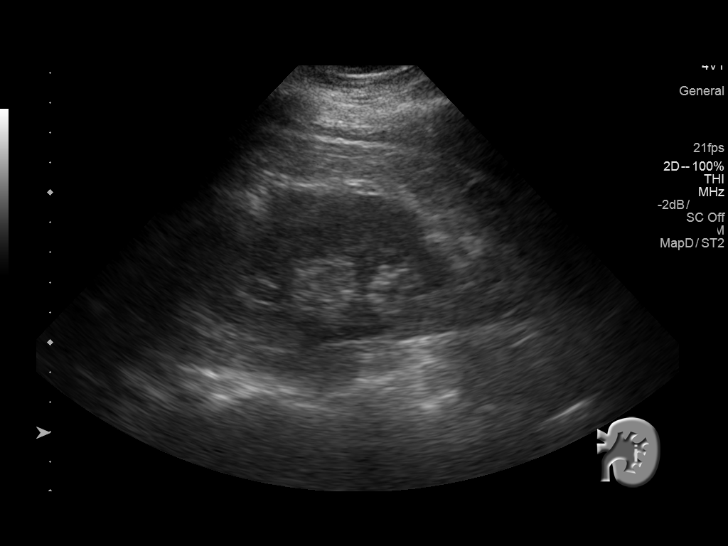
[im 70/77]
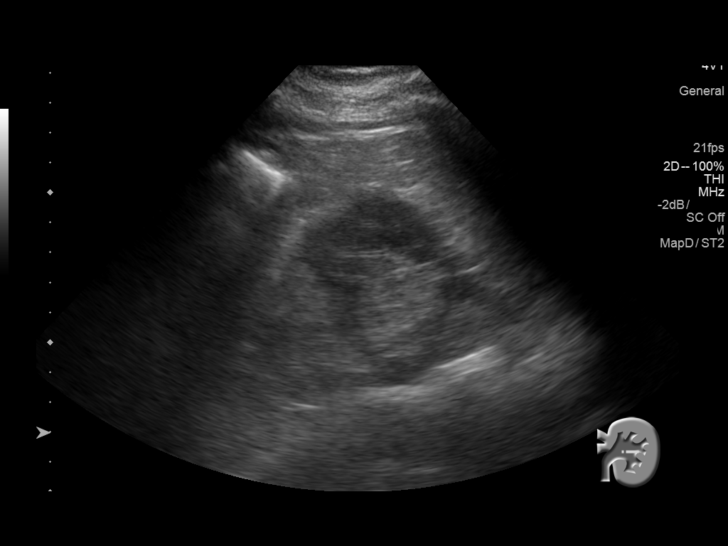
[im 77/77]
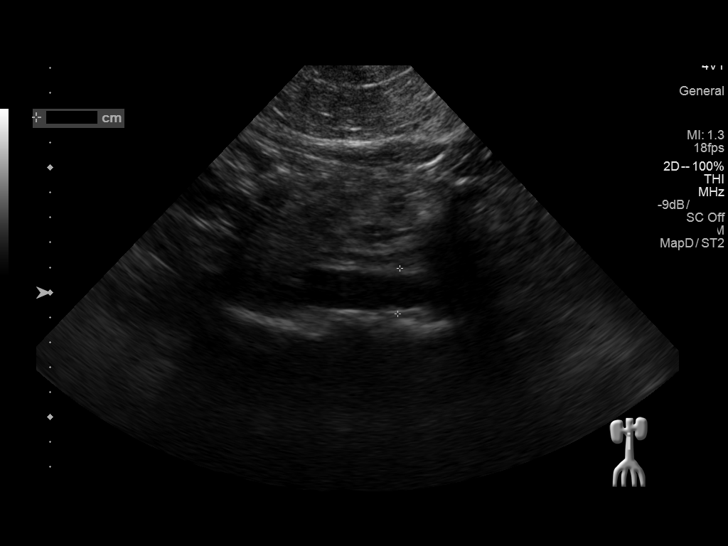

[14 of 25 positions shown; findings below may reference images not displayed]

FINDINGS: Gallbladder: Cholecystectomy.

Common bile duct: Diameter: 3.6 mm

Liver: Liver is echogenic consistent fatty infiltration and/or
hepatocellular disease. No focal hepatic abnormality identified.

IVC: No abnormality visualized.

Pancreas: Visualized portion unremarkable.

Spleen: Size and appearance within normal limits.

Right Kidney: Length: 10.7 cm. Echogenicity within normal limits. No
mass or hydronephrosis visualized. 1.1 cm echogenic focus with
posterior shadowing consistent with right nephrolithiasis.

Left Kidney: Length: 11.5 cm. Echogenicity within normal limits. No
mass or hydronephrosis visualized.

Abdominal aorta: No aneurysm visualized.

Other findings: Suboptimal exam due to overlying bowel gas.
IMPRESSION: 1. Cholecystectomy.  No biliary distention.

2. Liver is echogenic consistent fatty infiltration and/or
hepatocellular disease. No focal hepatic abnormality identified.

3.  Nonobstructing right renal stone again noted.

## 2017-03-06 ENCOUNTER — Ambulatory Visit: Payer: Medicare Other | Admitting: Family Medicine

## 2017-03-24 ENCOUNTER — Telehealth: Payer: Self-pay | Admitting: Family Medicine

## 2017-03-24 NOTE — Telephone Encounter (Signed)
How about [facility] in [place]?

## 2017-03-24 NOTE — Telephone Encounter (Signed)
[facility] did not tak ehis insurance and [facility] said that they would not prescribe meds he was on that he needed to go to [facility]

## 2017-03-24 NOTE — Telephone Encounter (Signed)
FYI

## 2017-03-24 NOTE — Telephone Encounter (Signed)
Do you have any information about this? Anyone who has turned him down or why? Do you need a new referral/ Thanks, WS

## 2017-03-31 ENCOUNTER — Ambulatory Visit (INDEPENDENT_AMBULATORY_CARE_PROVIDER_SITE_OTHER): Payer: Medicare Other | Admitting: Family Medicine

## 2017-03-31 ENCOUNTER — Encounter: Payer: Self-pay | Admitting: Family Medicine

## 2017-03-31 ENCOUNTER — Ambulatory Visit: Payer: Medicare Other | Admitting: Family Medicine

## 2017-03-31 VITALS — BP 116/79 | HR 110 | Temp 97.5°F | Ht 68.0 in | Wt 199.0 lb

## 2017-03-31 DIAGNOSIS — G546 Phantom limb syndrome with pain: Secondary | ICD-10-CM

## 2017-03-31 DIAGNOSIS — G959 Disease of spinal cord, unspecified: Secondary | ICD-10-CM | POA: Diagnosis not present

## 2017-03-31 DIAGNOSIS — F11288 Opioid dependence with other opioid-induced disorder: Secondary | ICD-10-CM

## 2017-03-31 MED ORDER — OXYCODONE HCL 30 MG PO TABA: 30.0000 mg | ORAL_TABLET | Freq: Three times a day (TID) | ORAL | 0 refills | Status: DC

## 2017-03-31 NOTE — Progress Notes (Signed)
Subjective:  Patient ID: [name], male    DOB: [birth year]  Age: 56 y.o. MRN: [id number]  CC: Follow-up (pt here today for medication refills)   HPI [name] presents for Weaned himself to 4 tablets daily of Oxycodone 30 mg. He had been as high as 8 recently. Patient was declined by multiple pain clinics. One because he was accused of misusing his meds that was at [name]. Another, [name] because he had left voluntarily and [name] declined to take him back. [facility] would not take him because of his insurance. [facility] will not [facility] him because of the high dose of medicine and wants him to be detoxed/weaned first and will then offer a spinal stimulator.Patient denies that the decrease is caused withdrawal symptoms. His pain remains at 6/10 while taking the medicine.  Depression screen [name] 2/9 03/31/2017 02/01/2017 12/21/2016  Decreased Interest 0 0 0  Down, Depressed, Hopeless 0 0 0  PHQ - 2 Score 0 0 0    History [name] has a past medical history of Anxiety; Constipation; Depression; Hepatitis; History of DVT (deep vein thrombosis); Hypercholesterolemia; Hypothyroidism; and Unspecified venous (peripheral) insufficiency.   He has a past surgical history that includes Back surgery; Foot surgery (Right); Below knee leg amputation (Right); Colonoscopy with propofol (N/A, 06/26/2015); and polypectomy (06/26/2015).   His family history includes Arthritis in his brother; Cancer in his brother and father; Depression in his brother; Mental illness in his brother.He reports that he has been smoking.  He has a 19.00 pack-year smoking history. He has never used smokeless tobacco. He reports that he does not drink alcohol or use drugs.    ROS Review of Systems  Constitutional: Negative for chills, diaphoresis, fever and unexpected weight change.  HENT: Negative for congestion, hearing loss, rhinorrhea and sore throat.   Eyes: Negative for visual disturbance.   Respiratory: Negative for cough and shortness of breath.   Cardiovascular: Negative for chest pain.  Gastrointestinal: Negative for abdominal pain, constipation and diarrhea.  Musculoskeletal: Positive for arthralgias and back pain. Negative for joint swelling.  Skin: Negative for rash.  Neurological: Negative for dizziness and headaches.  Psychiatric/Behavioral: Negative for dysphoric mood and sleep disturbance.    Objective:  BP 116/79   Pulse (!) 110   Temp (!) 97.5 F (36.4 C) (Oral)   Ht 5\' 8"  (1.727 m)   Wt 199 lb (90.3 kg)   BMI 30.26 kg/m   BP Readings from Last 3 Encounters:  03/31/17 116/79  02/01/17 107/79  12/21/16 (!) 89/62    Wt Readings from Last 3 Encounters:  03/31/17 199 lb (90.3 kg)  02/01/17 195 lb (88.5 kg)  12/21/16 200 lb (90.7 kg)     Physical Exam  Constitutional: He is oriented to person, place, and time. He appears well-developed and well-nourished. No distress.  HENT:  Head: Normocephalic and atraumatic.  Right Ear: External ear normal.  Left Ear: External ear normal.  Nose: Nose normal.  Mouth/Throat: Oropharynx is clear and moist.  Eyes: Pupils are equal, round, and reactive to light. Conjunctivae and EOM are normal.  Neck: Normal range of motion. Neck supple. No thyromegaly present.  Cardiovascular: Normal rate, regular rhythm and normal heart sounds.   No murmur heard. Pulmonary/Chest: Effort normal and breath sounds normal. No respiratory distress. He has no wheezes. He has no rales.  Abdominal: Soft. Bowel sounds are normal. He exhibits no distension. There is no tenderness.  Musculoskeletal: He exhibits deformity (R  BKA with prosthesis in place).  Lymphadenopathy:    He has no cervical adenopathy.  Neurological: He is alert and oriented to person, place, and time. He has normal reflexes.  Skin: Skin is warm and dry.  Psychiatric: He has a normal mood and affect. His behavior is normal. Judgment and thought content normal.       Assessment & Plan:   [name] was seen today for follow-up.  Diagnoses and all orders for this visit:  Lumbar myelopathy ([name]) -     ToxASSURE Select 13 (MW), Urine  Opioid dependence with other opioid-induced disorder ([name]) -     ToxASSURE Select 13 (MW), Urine  Phantom pain after amputation of lower extremity ([name]) -     ToxASSURE Select 13 (MW), Urine  Other orders -     OxyCODONE HCl 30 MG TABA; Take 30 mg by mouth 3 (three) times daily.       I have discontinued [name]'s lubiprostone and linaclotide. I have also changed his OxyCODONE HCl. Additionally, I am having him maintain his pravastatin, ALPRAZolam, QUEtiapine, rivaroxaban, docusate sodium, co-enzyme Q-10, multivitamin, B Complex-C (SUPER B COMPLEX PO), sildenafil, and levothyroxine.  Allergies as of 03/31/2017   No Known Allergies     Medication List       Accurate as of 03/31/17  5:14 PM. Always use your most recent med list.          ALPRAZolam 1 MG tablet Commonly known as:  XANAX Take 1 mg by mouth 3 (three) times daily.   co-enzyme Q-10 30 MG capsule Take 30 mg by mouth daily.   docusate sodium 100 MG capsule Commonly known as:  COLACE Take 2 capsules (200 mg total) by mouth daily.   levothyroxine 25 MCG tablet Commonly known as:  SYNTHROID, LEVOTHROID Take 1 tablet (25 mcg total) by mouth daily before breakfast.   multivitamin tablet Take 1 tablet by mouth daily.   OxyCODONE HCl 30 MG Taba Take 30 mg by mouth 3 (three) times daily.   pravastatin 20 MG tablet Commonly known as:  PRAVACHOL Take 20 mg by mouth daily.   QUEtiapine 100 MG tablet Commonly known as:  SEROQUEL Take 100 mg by mouth at bedtime.   rivaroxaban 20 MG Tabs tablet Commonly known as:  XARELTO Take 20 mg by mouth daily with supper.   sildenafil 20 MG tablet Commonly known as:  REVATIO Take 1 tablet (20 mg total) by mouth daily as needed (2-5 as needed).   SUPER B COMPLEX PO Take by mouth.             Discharge Care Instructions        Start     Ordered   03/31/17 0000  OxyCODONE HCl 30 MG TABA  3 times daily     03/31/17 1440   03/31/17 0000  ToxASSURE Select 13 (MW), Urine     03/31/17 1453       Follow-up: Return in about 2 weeks (around 04/14/2017).  Claretta Fraise, M.D.

## 2017-04-05 LAB — TOXASSURE SELECT 13 (MW), URINE

## 2017-04-13 ENCOUNTER — Ambulatory Visit (INDEPENDENT_AMBULATORY_CARE_PROVIDER_SITE_OTHER): Payer: Medicare Other | Admitting: Family Medicine

## 2017-04-13 ENCOUNTER — Encounter: Payer: Self-pay | Admitting: Family Medicine

## 2017-04-13 VITALS — BP 107/74 | HR 78 | Ht 68.0 in | Wt 207.0 lb

## 2017-04-13 DIAGNOSIS — F11288 Opioid dependence with other opioid-induced disorder: Secondary | ICD-10-CM | POA: Diagnosis not present

## 2017-04-13 DIAGNOSIS — G546 Phantom limb syndrome with pain: Secondary | ICD-10-CM

## 2017-04-13 DIAGNOSIS — Z23 Encounter for immunization: Secondary | ICD-10-CM

## 2017-04-13 DIAGNOSIS — Z0289 Encounter for other administrative examinations: Secondary | ICD-10-CM

## 2017-04-13 DIAGNOSIS — G959 Disease of spinal cord, unspecified: Secondary | ICD-10-CM

## 2017-04-13 MED ORDER — OXYCODONE HCL 15 MG PO TABA: 15.0000 mg | ORAL_TABLET | Freq: Every day | ORAL | 0 refills | Status: DC

## 2017-04-13 NOTE — Progress Notes (Signed)
Subjective:  Patient ID: [name], male    DOB: Nov 23, 1960  Age: 56 y.o. MRN: [id number]  CC: Follow-up (pt here today for follow up and to discuss pain medication)   HPI [name] presents for Pain and has rebounded to 7-8/10 since decreasing his dose. He's been awake since 3 AM. He is willing to continue the taper since he's had no withdrawal syndrome. He understands that the pain clinic will not accept him until he's been weaning/detoxed. He is considered a program in [place] because they also do epidural steroid injections. However the distances difficult. Depression screen [facility] 2/9 04/13/2017 03/31/2017 02/01/2017  Decreased Interest 0 0 0  Down, Depressed, Hopeless 0 0 0  PHQ - 2 Score 0 0 0    History [name] has a past medical history of Anxiety; Constipation; Depression; Hepatitis; History of DVT (deep vein thrombosis); Hypercholesterolemia; Hypothyroidism; and Unspecified venous (peripheral) insufficiency.   He has a past surgical history that includes Back surgery; Foot surgery (Right); Below knee leg amputation (Right); Colonoscopy with propofol (N/A, 06/26/2015); and polypectomy (06/26/2015).   His family history includes Arthritis in his brother; Cancer in his brother and father; Depression in his brother; Mental illness in his brother.He reports that he has been smoking.  He has a 19.00 pack-year smoking history. He has never used smokeless tobacco. He reports that he does not drink alcohol or use drugs.    ROS Review of Systems Noncontributory exception of history of present illness Objective:  BP 107/74   Pulse 78   Ht 5\' 8"  (1.727 m)   Wt 207 lb (93.9 kg)   BMI 31.47 kg/m   BP Readings from Last 3 Encounters:  04/13/17 107/74  03/31/17 116/79  02/01/17 107/79    Wt Readings from Last 3 Encounters:  04/13/17 207 lb (93.9 kg)  03/31/17 199 lb (90.3 kg)  02/01/17 195 lb (88.5 kg)     Physical Exam  Constitutional: He is oriented to person, place, and  time. He appears well-developed and well-nourished.  HENT:  Head: Normocephalic and atraumatic.  Right Ear: External ear normal.  Left Ear: External ear normal.  Mouth/Throat: No oropharyngeal exudate or posterior oropharyngeal erythema.  Eyes: Pupils are equal, round, and reactive to light.  Neck: Normal range of motion. Neck supple.  Cardiovascular: Normal rate and regular rhythm.   No murmur heard. Pulmonary/Chest: Breath sounds normal. No respiratory distress.  Musculoskeletal: Normal range of motion.  Right BKA  Neurological: He is alert and oriented to person, place, and time.  Psychiatric: He has a normal mood and affect. His behavior is normal. Thought content normal.  Vitals reviewed.     Assessment & Plan:   [name] was seen today for follow-up.  Diagnoses and all orders for this visit:  Phantom pain after amputation of lower extremity ([place])  Lumbar myelopathy ([place])  Opioid dependence with other opioid-induced disorder ([place])  Pain management contract agreement  Other orders -     OxyCODONE HCl 15 MG TABA; Take 15 mg by mouth 5 (five) times daily.   Toxassure repeated today due to inconsistency on a previous test.    I have discontinued [name]'s pravastatin and OxyCODONE HCl. I am also having him start on OxyCODONE HCl. Additionally, I am having him maintain his ALPRAZolam, QUEtiapine, rivaroxaban, docusate sodium, co-enzyme Q-10, multivitamin, B Complex-C (SUPER B COMPLEX PO), sildenafil, and levothyroxine.  Allergies as of 04/13/2017   No Known Allergies     Medication List  Accurate as of 04/13/17  2:05 PM. Always use your most recent med list.          ALPRAZolam 1 MG tablet Commonly known as:  XANAX Take 1 mg by mouth 3 (three) times daily.   co-enzyme Q-10 30 MG capsule Take 30 mg by mouth daily.   docusate sodium 100 MG capsule Commonly known as:  COLACE Take 2 capsules (200 mg total) by mouth daily.   levothyroxine 25 MCG  tablet Commonly known as:  SYNTHROID, LEVOTHROID Take 1 tablet (25 mcg total) by mouth daily before breakfast.   multivitamin tablet Take 1 tablet by mouth daily.   OxyCODONE HCl 15 MG Taba Take 15 mg by mouth 5 (five) times daily.   QUEtiapine 100 MG tablet Commonly known as:  SEROQUEL Take 100 mg by mouth at bedtime.   rivaroxaban 20 MG Tabs tablet Commonly known as:  XARELTO Take 20 mg by mouth daily with supper.   sildenafil 20 MG tablet Commonly known as:  REVATIO Take 1 tablet (20 mg total) by mouth daily as needed (2-5 as needed).   SUPER B COMPLEX PO Take by mouth.        Follow-up: Return in about 2 weeks (around 04/27/2017).  Claretta Fraise, M.D.

## 2017-04-15 ENCOUNTER — Other Ambulatory Visit: Payer: Self-pay | Admitting: Family Medicine

## 2017-04-25 ENCOUNTER — Encounter: Payer: Self-pay | Admitting: Family Medicine

## 2017-04-25 ENCOUNTER — Ambulatory Visit (INDEPENDENT_AMBULATORY_CARE_PROVIDER_SITE_OTHER): Payer: Medicare Other | Admitting: Family Medicine

## 2017-04-25 VITALS — BP 112/81 | HR 111 | Temp 97.3°F | Ht 68.0 in | Wt 206.0 lb

## 2017-04-25 DIAGNOSIS — F11288 Opioid dependence with other opioid-induced disorder: Secondary | ICD-10-CM

## 2017-04-25 MED ORDER — OXYCODONE HCL 15 MG PO TABA
15.0000 mg | ORAL_TABLET | Freq: Four times a day (QID) | ORAL | 0 refills | Status: DC
Start: 2017-04-25 — End: 2018-01-31

## 2017-04-25 NOTE — Progress Notes (Signed)
Subjective:  Patient ID: [name], male    DOB: 03-26-1961  Age: 56 y.o. MRN: [id number]  CC: Medication Management (pt here today for a 2 week follow up on his pain management)   HPI [name] presents for Continuation of his weaning process. Pain clinic has said they would not see him until he was detoxed. Patient has agreed to weaning more slowly. However, during the last 2 weeks he says his pain has increased and he is not sleeping well. He denies the chills sweats fever diarrhea and abdominal pain etc. that would go with withdrawal but his pain has been very very severe. Patient says he is just tired out from this. He says that he thought I was going to take over his pain management long-term so he did not pursue the [place] option.  Depression screen [facility] 2/9 04/25/2017 04/13/2017 03/31/2017  Decreased Interest 0 0 0  Down, Depressed, Hopeless 0 0 0  PHQ - 2 Score 0 0 0    History Ejay has a past medical history of Anxiety; Constipation; Depression; Hepatitis; History of DVT (deep vein thrombosis); Hypercholesterolemia; Hypothyroidism; and Unspecified venous (peripheral) insufficiency.   He has a past surgical history that includes Back surgery; Foot surgery (Right); Below knee leg amputation (Right); Colonoscopy with propofol (N/A, 06/26/2015); and polypectomy (06/26/2015).   His family history includes Arthritis in his brother; Cancer in his brother and father; Depression in his brother; Mental illness in his brother.He reports that he has been smoking.  He has a 19.00 pack-year smoking history. He has never used smokeless tobacco. He reports that he does not drink alcohol or use drugs.    ROS Review of Systems Noncontributory except as in history of present illness Objective:  BP 112/81   Pulse (!) 111   Temp (!) 97.3 F (36.3 C) (Oral)   Ht 5\' 8"  (1.727 m)   Wt 206 lb (93.4 kg)   BMI 31.32 kg/m   BP Readings from Last 3 Encounters:  04/25/17 112/81  04/13/17  107/74  03/31/17 116/79    Wt Readings from Last 3 Encounters:  04/25/17 206 lb (93.4 kg)  04/13/17 207 lb (93.9 kg)  03/31/17 199 lb (90.3 kg)     Physical Exam  Constitutional: He is oriented to person, place, and time. He appears well-developed and well-nourished. No distress.  HENT:  Head: Normocephalic and atraumatic.  Eyes: Pupils are equal, round, and reactive to light. Conjunctivae and EOM are normal.  Neck: Normal range of motion. Neck supple.  Pulmonary/Chest: Effort normal.  Musculoskeletal: Normal range of motion.  Neurological: He is alert and oriented to person, place, and time.  Skin: Skin is warm and dry. No erythema.  Psychiatric: He has a normal mood and affect. His behavior is normal.      Assessment & Plan:   Destiny was seen today for medication management.  Diagnoses and all orders for this visit:  Opioid dependence with other opioid-induced disorder ([place])       I have discontinued Mr. Reede's docusate sodium and multivitamin. I am also having him maintain his ALPRAZolam, QUEtiapine, rivaroxaban, co-enzyme Q-10, B Complex-C (SUPER B COMPLEX PO), sildenafil, OxyCODONE HCl, and levothyroxine.  Allergies as of 04/25/2017   No Known Allergies     Medication List       Accurate as of 04/25/17 10:46 AM. Always use your most recent med list.          ALPRAZolam 1 MG tablet Commonly known  as:  XANAX Take 1 mg by mouth 3 (three) times daily.   co-enzyme Q-10 30 MG capsule Take 30 mg by mouth daily.   levothyroxine 25 MCG tablet Commonly known as:  SYNTHROID, LEVOTHROID TAKE 1 TABLET BEFORE BREAKFAST.   OxyCODONE HCl 15 MG Taba Take 15 mg by mouth 5 (five) times daily.   QUEtiapine 100 MG tablet Commonly known as:  SEROQUEL Take 100 mg by mouth at bedtime.   rivaroxaban 20 MG Tabs tablet Commonly known as:  XARELTO Take 20 mg by mouth daily with supper.   sildenafil 20 MG tablet Commonly known as:  REVATIO Take 1 tablet (20 mg  total) by mouth daily as needed (2-5 as needed).   SUPER B COMPLEX PO Take by mouth.      Patient was reminded to bring all of his medication with him when he comes for follow-up. He is 2 days early today so he was given 2 days less medicine and will follow up in 1 month. We'll continue the slow weaning at that time. And he understands that we are not trying to diminish his pain, the goal is to diminish withdrawal symptoms so that he can be accepted into the pain clinic that had requested detox.  Follow-up: Return in about 1 month (around 05/26/2017).  Claretta Fraise, M.D.

## 2017-04-25 NOTE — Addendum Note (Signed)
Addended by: Marylin Crosby on: 04/25/2017 11:00 AM   Modules accepted: Orders

## 2017-04-25 NOTE — Patient Instructions (Signed)
Bring all pills with you to each appointment.

## 2017-04-28 ENCOUNTER — Ambulatory Visit: Payer: Medicare Other | Admitting: Family Medicine

## 2017-05-01 LAB — TOXASSURE SELECT 13 (MW), URINE

## 2017-05-11 ENCOUNTER — Telehealth: Payer: Self-pay | Admitting: Family Medicine

## 2017-05-11 NOTE — Telephone Encounter (Signed)
Patient was brought in by EMS unconscious.  Overdosed on oxycodone.  They brought him around with Narcan.  ER doc says he is seen chronically in the ER wanting pain meds.  Also getting them from [facility].  Family asked that you please not prescribe him any more pain medicines.  Wanted to make you aware of this

## 2017-05-11 NOTE — Telephone Encounter (Signed)
Please tell pt. I can refer him for detox and rehab. I can no longer prescribe his opiates.

## 2017-05-26 ENCOUNTER — Ambulatory Visit: Payer: Medicare Other | Admitting: Family Medicine

## 2017-05-29 ENCOUNTER — Encounter: Payer: Self-pay | Admitting: Family Medicine

## 2017-09-05 ENCOUNTER — Telehealth: Payer: Self-pay

## 2017-09-05 DIAGNOSIS — R52 Pain, unspecified: Secondary | ICD-10-CM

## 2017-09-05 NOTE — Telephone Encounter (Signed)
Please refer as requested 

## 2017-09-05 NOTE — Telephone Encounter (Signed)
Patient wants a referral to Dr Merlene Laughter for pain management

## 2017-09-06 NOTE — Telephone Encounter (Signed)
Referral placed.

## 2017-12-19 ENCOUNTER — Other Ambulatory Visit: Payer: Self-pay | Admitting: Family Medicine

## 2018-01-01 ENCOUNTER — Telehealth: Payer: Self-pay

## 2018-01-01 DIAGNOSIS — R52 Pain, unspecified: Secondary | ICD-10-CM

## 2018-01-01 DIAGNOSIS — F11288 Opioid dependence with other opioid-induced disorder: Secondary | ICD-10-CM

## 2018-01-01 NOTE — Telephone Encounter (Signed)
Pt would like to see DR Merlene Laughter for pain mgmt - [place]

## 2018-01-01 NOTE — Telephone Encounter (Signed)
Patient wants a referral to Dr Merlene Laughter

## 2018-01-01 NOTE — Telephone Encounter (Signed)
No answer, no voicemail; referral placed

## 2018-01-01 NOTE — Telephone Encounter (Signed)
Spoke with pt - aware of referral  

## 2018-01-01 NOTE — Telephone Encounter (Signed)
Please refer as pt requests 

## 2018-01-31 ENCOUNTER — Ambulatory Visit: Payer: Medicare Other | Admitting: Family Medicine

## 2018-01-31 ENCOUNTER — Encounter: Payer: Self-pay | Admitting: Family Medicine

## 2018-01-31 VITALS — BP 114/76 | HR 96 | Temp 97.1°F | Ht 68.0 in | Wt 191.2 lb

## 2018-01-31 DIAGNOSIS — R222 Localized swelling, mass and lump, trunk: Secondary | ICD-10-CM

## 2018-01-31 DIAGNOSIS — H9313 Tinnitus, bilateral: Secondary | ICD-10-CM

## 2018-01-31 NOTE — Patient Instructions (Signed)

## 2018-02-08 ENCOUNTER — Encounter: Payer: Self-pay | Admitting: Family Medicine

## 2018-02-08 NOTE — Progress Notes (Signed)
Subjective:  Patient ID: [name], male    DOB: 05-09-1961  Age: 57 y.o. MRN: [id number]  CC: Medical Management of Chronic Issues (pt here today for routine follow up of chronic medical conditions and also c/o ears ringing and "knot" on left side of chest)   HPI [name] presents for concerned about knot on left side of chest. It is near the sternum. It is not painful. Noted recently.  Also having a ringing in the ears.Able to hear normally. No pain .  Depression screen [facility] 2/9 01/31/2018 04/25/2017 04/13/2017  Decreased Interest 0 0 0  Down, Depressed, Hopeless 0 0 0  PHQ - 2 Score 0 0 0    History Albaraa has a past medical history of Anxiety, Constipation, Depression, Hepatitis, History of DVT (deep vein thrombosis), Hypercholesterolemia, Hypothyroidism, and Unspecified venous (peripheral) insufficiency.   He has a past surgical history that includes Back surgery; Foot surgery (Right); Below knee leg amputation (Right); Colonoscopy with propofol (N/A, 06/26/2015); and polypectomy (06/26/2015).   His family history includes Arthritis in his brother; Cancer in his brother and father; Depression in his brother; Mental illness in his brother.He reports that he has been smoking.  He has a 19.00 pack-year smoking history. He has never used smokeless tobacco. He reports that he does not drink alcohol or use drugs.    ROS Review of Systems  Constitutional: Negative for fever.  Respiratory: Negative for shortness of breath.   Cardiovascular: Negative for chest pain.  Musculoskeletal: Negative for arthralgias.  Skin: Negative for rash.    Objective:  BP 114/76   Pulse 96   Temp (!) 97.1 F (36.2 C) (Oral)   Ht 5\' 8"  (1.727 m)   Wt 191 lb 4 oz (86.8 kg)   BMI 29.08 kg/m   BP Readings from Last 3 Encounters:  01/31/18 114/76  04/25/17 112/81  04/13/17 107/74    Wt Readings from Last 3 Encounters:  01/31/18 191 lb 4 oz (86.8 kg)  04/25/17 206 lb (93.4 kg)  04/13/17  207 lb (93.9 kg)     Physical Exam  Constitutional: He is oriented to person, place, and time. He appears well-developed and well-nourished.  HENT:  Head: Normocephalic and atraumatic.  Right Ear: External ear normal.  Left Ear: External ear normal.  Mouth/Throat: No oropharyngeal exudate or posterior oropharyngeal erythema.  Eyes: Pupils are equal, round, and reactive to light.  Neck: Normal range of motion. Neck supple.  Cardiovascular: Normal rate and regular rhythm.  No murmur heard. Pulmonary/Chest: Breath sounds normal. No respiratory distress.  Neurological: He is alert and oriented to person, place, and time.  Vitals reviewed.     Assessment & Plan:   Pierre was seen today for medical management of chronic issues.  Diagnoses and all orders for this visit:  Nodule of chest wall -     Ambulatory referral to Dermatology  Tinnitus of both ears       I have discontinued Waynette Buttery. Klontz's oxyCODONE HCl. I am also having him maintain his ALPRAZolam, QUEtiapine, rivaroxaban, co-enzyme Q-10, B Complex-C (SUPER B COMPLEX PO), sildenafil, and levothyroxine.  Allergies as of 01/31/2018   No Known Allergies     Medication List        Accurate as of 01/31/18 11:59 PM. Always use your most recent med list.          ALPRAZolam 1 MG tablet Commonly known as:  XANAX Take 1 mg by mouth 3 (three) times daily.  co-enzyme Q-10 30 MG capsule Take 30 mg by mouth daily.   levothyroxine 25 MCG tablet Commonly known as:  SYNTHROID, LEVOTHROID TAKE 1 TABLET BEFORE BREAKFAST.   QUEtiapine 100 MG tablet Commonly known as:  SEROQUEL Take 100 mg by mouth at bedtime.   rivaroxaban 20 MG Tabs tablet Commonly known as:  XARELTO Take 20 mg by mouth daily with supper.   sildenafil 20 MG tablet Commonly known as:  REVATIO Take 1 tablet (20 mg total) by mouth daily as needed (2-5 as needed).   SUPER B COMPLEX PO Take by mouth.      Tinnitus referral if  requested  Follow-up: Return if symptoms worsen or fail to improve.  Claretta Fraise, M.D.

## 2018-03-07 ENCOUNTER — Other Ambulatory Visit: Payer: Self-pay | Admitting: Family Medicine

## 2018-03-08 NOTE — Telephone Encounter (Signed)
Last seen 01/31/18  Dr Livia Snellen

## 2018-04-09 ENCOUNTER — Other Ambulatory Visit: Payer: Self-pay | Admitting: Nurse Practitioner

## 2018-04-10 ENCOUNTER — Other Ambulatory Visit: Payer: Self-pay | Admitting: Family Medicine

## 2018-04-10 MED ORDER — QUETIAPINE FUMARATE 100 MG PO TABS: 100.0000 mg | ORAL_TABLET | Freq: Every day | ORAL | 1 refills | Status: DC

## 2018-04-10 MED ORDER — LEVOTHYROXINE SODIUM 25 MCG PO TABS: ORAL_TABLET | ORAL | 0 refills | Status: DC

## 2018-04-10 MED ORDER — SILDENAFIL CITRATE 20 MG PO TABS: ORAL_TABLET | ORAL | 2 refills | Status: DC

## 2018-04-10 MED ORDER — RIVAROXABAN 20 MG PO TABS: 20.0000 mg | ORAL_TABLET | Freq: Every day | ORAL | 1 refills | Status: DC

## 2018-04-10 NOTE — Telephone Encounter (Signed)
Okay to refill all meds for 6 mos 

## 2018-04-10 NOTE — Addendum Note (Signed)
Addended by: Chevis Pretty on: 04/10/2018 01:21 PM   Modules accepted: Orders

## 2018-04-10 NOTE — Telephone Encounter (Signed)
MMM. NTBS last TSH 02/01/17

## 2018-04-10 NOTE — Telephone Encounter (Signed)
Patient aware and verbalizes understanding- wanting to know if he can get one more month of his levothyroxine until he gets in to be seen? States he does not drive- please advise

## 2018-04-10 NOTE — Telephone Encounter (Signed)
patient given  a 30 day refill of levothyroxine - ntbs before running out

## 2018-04-10 NOTE — Telephone Encounter (Signed)
What is the name of the medication? Levothyroxine 25 mg Patient can't come in to see St[facility] til 05-15-18 because he don't drive and he has three other appt out of town this month. Wants a month supply called in until 11-5  Have you contacted your pharmacy to request a refill? Yes  Which pharmacy would you like this sent to? [facility]   Patient notified that their request is being sent to the clinical staff for review and that they should receive a call once it is complete. If they do not receive a call within 24 hours they can check with their pharmacy or our office.

## 2018-05-15 ENCOUNTER — Ambulatory Visit: Payer: Medicare Other | Admitting: Family Medicine

## 2018-05-17 ENCOUNTER — Telehealth: Payer: Self-pay | Admitting: Family Medicine

## 2018-05-17 NOTE — Telephone Encounter (Signed)
Last TSH 01/2017

## 2018-05-18 MED ORDER — LEVOTHYROXINE SODIUM 25 MCG PO TABS: ORAL_TABLET | ORAL | 0 refills | Status: DC

## 2018-05-18 NOTE — Telephone Encounter (Signed)
Patient aware rx sent  

## 2018-05-18 NOTE — Telephone Encounter (Signed)
Okay to refill levothyroxine X 3 mos

## 2018-05-22 ENCOUNTER — Ambulatory Visit: Payer: Medicare Other | Admitting: Family Medicine

## 2018-06-12 ENCOUNTER — Ambulatory Visit: Payer: Medicare Other | Admitting: Family Medicine

## 2018-08-02 ENCOUNTER — Ambulatory Visit: Payer: Medicare Other | Admitting: Family Medicine

## 2018-08-21 ENCOUNTER — Telehealth: Payer: Self-pay | Admitting: Family Medicine

## 2018-08-21 NOTE — Telephone Encounter (Signed)
Attempted to contact patient - NVM 

## 2018-08-21 NOTE — Telephone Encounter (Signed)
Will work on that with him at time of appointment. WS

## 2018-08-21 NOTE — Telephone Encounter (Signed)
PT is aware that he will have to wait till his apt on 2/18 to discuss the referral. Pt verbalized understanding.

## 2018-08-28 ENCOUNTER — Ambulatory Visit: Payer: Medicare Other | Admitting: Family Medicine

## 2018-08-30 ENCOUNTER — Encounter: Payer: Self-pay | Admitting: Family Medicine

## 2018-08-30 ENCOUNTER — Ambulatory Visit: Payer: Medicare Other | Admitting: Family Medicine

## 2018-08-30 VITALS — BP 95/67 | HR 81 | Temp 98.1°F | Ht 68.0 in | Wt 207.5 lb

## 2018-08-30 DIAGNOSIS — Z125 Encounter for screening for malignant neoplasm of prostate: Secondary | ICD-10-CM

## 2018-08-30 DIAGNOSIS — E7800 Pure hypercholesterolemia, unspecified: Secondary | ICD-10-CM | POA: Diagnosis not present

## 2018-08-30 DIAGNOSIS — E038 Other specified hypothyroidism: Secondary | ICD-10-CM | POA: Diagnosis not present

## 2018-08-30 MED ORDER — TRAZODONE HCL 150 MG PO TABS: ORAL_TABLET | ORAL | 5 refills | Status: DC

## 2018-08-30 MED ORDER — LEVOTHYROXINE SODIUM 25 MCG PO TABS: ORAL_TABLET | ORAL | 1 refills | Status: DC

## 2018-08-30 MED ORDER — SILDENAFIL CITRATE 20 MG PO TABS: ORAL_TABLET | ORAL | 5 refills | Status: DC

## 2018-08-30 MED ORDER — QUETIAPINE FUMARATE 100 MG PO TABS: 100.0000 mg | ORAL_TABLET | Freq: Every day | ORAL | 1 refills | Status: DC

## 2018-08-30 NOTE — Progress Notes (Signed)
Subjective:  Patient ID: [name], male    DOB: 04-03-1961  Age: 58 y.o. MRN: [id number]  CC: Medical Management of Chronic Issues   HPI GEOFFREY HYNES presents for patient presents for follow-up on  thyroid. The patient has a history of hypothyroidism for many years. It has been stable recently. Pt. denies any change in  voice, loss of hair, heat or cold intolerance. Energy level has been adequate to good. Patient denies constipation and diarrhea. No myxedema. Medication is as noted below. Verified that pt is taking it daily on an empty stomach. Well tolerated. Patient in for follow-up of elevated cholesterol. Doing well without complaints on current medication. Denies side effects of statin including myalgia and arthralgia and nausea. Also in today for liver function testing. Currently no chest pain, shortness of breath or other cardiovascular related symptoms noted. Patient is followed by pain clinic now for his pain meds.  He continues to have phantom pain from his right knee amputation.  Additionally has lumbar myelopathy.  Depression screen [facility] 2/9 08/30/2018 01/31/2018 04/25/2017  Decreased Interest 1 0 0  Down, Depressed, Hopeless 1 0 0  PHQ - 2 Score 2 0 0  Altered sleeping 3 - -  Tired, decreased energy 3 - -  Change in appetite 1 - -  Feeling bad or failure about yourself  0 - -  Trouble concentrating 1 - -  Moving slowly or fidgety/restless 0 - -  Suicidal thoughts 0 - -  PHQ-9 Score 10 - -    History Ulices has a past medical history of Anxiety, Constipation, Depression, Hepatitis, History of DVT (deep vein thrombosis), Hypercholesterolemia, Hypothyroidism, and Unspecified venous (peripheral) insufficiency.   He has a past surgical history that includes Back surgery; Foot surgery (Right); Below knee leg amputation (Right); Colonoscopy with propofol (N/A, 06/26/2015); and polypectomy (06/26/2015).   His family history includes Arthritis in his brother; Cancer in his  brother and father; Depression in his brother; Mental illness in his brother.He reports that he has been smoking. He has a 19.00 pack-year smoking history. He has never used smokeless tobacco. He reports that he does not drink alcohol or use drugs.    ROS Review of Systems  Constitutional: Negative for fever.  Respiratory: Negative for shortness of breath.   Cardiovascular: Negative for chest pain.  Musculoskeletal: Negative for arthralgias.  Skin: Negative for rash.    Objective:  BP 95/67   Pulse 81   Temp 98.1 F (36.7 C) (Oral)   Ht '5\' 8"'$  (1.727 m)   Wt 207 lb 8 oz (94.1 kg)   BMI 31.55 kg/m   BP Readings from Last 3 Encounters:  08/30/18 95/67  01/31/18 114/76  04/25/17 112/81    Wt Readings from Last 3 Encounters:  08/30/18 207 lb 8 oz (94.1 kg)  01/31/18 191 lb 4 oz (86.8 kg)  04/25/17 206 lb (93.4 kg)     Physical Exam Vitals signs reviewed.  Constitutional:      Appearance: He is well-developed.  HENT:     Head: Normocephalic and atraumatic.     Right Ear: External ear normal.     Left Ear: External ear normal.     Mouth/Throat:     Pharynx: No oropharyngeal exudate or posterior oropharyngeal erythema.  Eyes:     Pupils: Pupils are equal, round, and reactive to light.  Neck:     Musculoskeletal: Normal range of motion and neck supple.  Cardiovascular:     Rate and  Rhythm: Normal rate and regular rhythm.     Heart sounds: No murmur.  Pulmonary:     Effort: No respiratory distress.     Breath sounds: Normal breath sounds.  Neurological:     Mental Status: He is alert and oriented to person, place, and time.       Assessment & Plan:   Anselmo was seen today for medical management of chronic issues.  Diagnoses and all orders for this visit:  Other specified hypothyroidism -     TSH -     T4, Free  High cholesterol -     CBC with Differential/Platelet -     CMP14+EGFR -     Lipid panel  Special screening for malignant neoplasm of  prostate -     PSA, total and free  Other orders -     Discontinue: levothyroxine (SYNTHROID, LEVOTHROID) 25 MCG tablet; TAKE 1 TABLET BEFORE BREAKFAST. -     QUEtiapine (SEROQUEL) 100 MG tablet; Take 1 tablet (100 mg total) by mouth at bedtime. -     sildenafil (REVATIO) 20 MG tablet; TAKE 2 TO 5 TABLETS DAILY AS NEEDED. -     traZODone (DESYREL) 150 MG tablet; Use from 1/3 to 1 tablet nightly as needed for sleep.       I have discontinued Jayleon Mcfarlane. [place]'s levothyroxine. I am also having him start on traZODone. Additionally, I am having him maintain his ALPRAZolam, co-enzyme Q-10, B Complex-C (SUPER B COMPLEX PO), rivaroxaban, Cyanocobalamin (VITAMIN B12 PO), hydrOXYzine, oxyCODONE-acetaminophen, pregabalin, QUEtiapine, and sildenafil.  Allergies as of 08/30/2018   No Known Allergies     Medication List       Accurate as of August 30, 2018 11:59 PM. Always use your most recent med list.        ALPRAZolam 1 MG tablet Commonly known as:  XANAX Take 1 mg by mouth 3 (three) times daily.   co-enzyme Q-10 30 MG capsule Take 30 mg by mouth daily.   hydrOXYzine 50 MG tablet Commonly known as:  ATARAX/VISTARIL Take 50 mg by mouth at bedtime.   levothyroxine 25 MCG tablet Commonly known as:  SYNTHROID, LEVOTHROID TAKE 1 TABLET BEFORE BREAKFAST.   oxyCODONE-acetaminophen 10-325 MG tablet Commonly known as:  PERCOCET   pregabalin 200 MG capsule Commonly known as:  LYRICA Take 200 mg by mouth daily.   QUEtiapine 100 MG tablet Commonly known as:  SEROQUEL Take 1 tablet (100 mg total) by mouth at bedtime.   rivaroxaban 20 MG Tabs tablet Commonly known as:  XARELTO Take 1 tablet (20 mg total) by mouth daily with supper.   sildenafil 20 MG tablet Commonly known as:  REVATIO TAKE 2 TO 5 TABLETS DAILY AS NEEDED.   SUPER B COMPLEX PO Take by mouth.   traZODone 150 MG tablet Commonly known as:  DESYREL Use from 1/3 to 1 tablet nightly as needed for sleep.   VITAMIN  B12 PO Take by mouth.        Follow-up: Return in about 6 months (around 02/28/2019).  Claretta Fraise, M.D.

## 2018-08-31 ENCOUNTER — Other Ambulatory Visit: Payer: Self-pay | Admitting: Family Medicine

## 2018-08-31 LAB — CBC WITH DIFFERENTIAL/PLATELET
Basophils Absolute: 0 10*3/uL (ref 0.0–0.2)
Basos: 0 %
EOS (ABSOLUTE): 0.1 10*3/uL (ref 0.0–0.4)
Eos: 1 %
Hematocrit: 38.1 % (ref 37.5–51.0)
Hemoglobin: 12.9 g/dL — ABNORMAL LOW (ref 13.0–17.7)
Immature Grans (Abs): 0 10*3/uL (ref 0.0–0.1)
Immature Granulocytes: 0 %
Lymphocytes Absolute: 3 10*3/uL (ref 0.7–3.1)
Lymphs: 51 %
MCH: 31.9 pg (ref 26.6–33.0)
MCHC: 33.9 g/dL (ref 31.5–35.7)
MCV: 94 fL (ref 79–97)
Monocytes Absolute: 0.5 10*3/uL (ref 0.1–0.9)
Monocytes: 8 %
Neutrophils Absolute: 2.3 10*3/uL (ref 1.4–7.0)
Neutrophils: 40 %
Platelets: 192 10*3/uL (ref 150–450)
RBC: 4.05 x10E6/uL — ABNORMAL LOW (ref 4.14–5.80)
RDW: 11.7 % (ref 11.6–15.4)
WBC: 5.8 10*3/uL (ref 3.4–10.8)

## 2018-08-31 LAB — CMP14+EGFR
ALT: 18 IU/L (ref 0–44)
AST: 24 IU/L (ref 0–40)
Albumin/Globulin Ratio: 1.3 (ref 1.2–2.2)
Albumin: 3.8 g/dL (ref 3.8–4.9)
Alkaline Phosphatase: 103 IU/L (ref 39–117)
BUN/Creatinine Ratio: 8 — ABNORMAL LOW (ref 9–20)
BUN: 7 mg/dL (ref 6–24)
Bilirubin Total: 0.3 mg/dL (ref 0.0–1.2)
CO2: 25 mmol/L (ref 20–29)
Calcium: 8.7 mg/dL (ref 8.7–10.2)
Chloride: 94 mmol/L — ABNORMAL LOW (ref 96–106)
Creatinine, Ser: 0.83 mg/dL (ref 0.76–1.27)
GFR calc Af Amer: 113 mL/min/{1.73_m2} (ref >59)
GFR calc non Af Amer: 98 mL/min/{1.73_m2} (ref >59)
Globulin, Total: 3 g/dL (ref 1.5–4.5)
Glucose: 76 mg/dL (ref 65–99)
Potassium: 4 mmol/L (ref 3.5–5.2)
Sodium: 138 mmol/L (ref 134–144)
Total Protein: 6.8 g/dL (ref 6.0–8.5)

## 2018-08-31 LAB — PSA, TOTAL AND FREE
PSA, Free Pct: 22.5 %
PSA, Free: 0.18 ng/mL
Prostate Specific Ag, Serum: 0.8 ng/mL (ref 0.0–4.0)

## 2018-08-31 LAB — T4, FREE: Free T4: 1.06 ng/dL (ref 0.82–1.77)

## 2018-08-31 LAB — LIPID PANEL
Chol/HDL Ratio: 4.8 ratio (ref 0.0–5.0)
Cholesterol, Total: 172 mg/dL (ref 100–199)
HDL: 36 mg/dL — ABNORMAL LOW (ref >39)
LDL Calculated: 117 mg/dL — ABNORMAL HIGH (ref 0–99)
Triglycerides: 96 mg/dL (ref 0–149)
VLDL Cholesterol Cal: 19 mg/dL (ref 5–40)

## 2018-08-31 LAB — TSH: TSH: 5.28 u[IU]/mL — ABNORMAL HIGH (ref 0.450–4.500)

## 2018-08-31 MED ORDER — LEVOTHYROXINE SODIUM 50 MCG PO TABS: ORAL_TABLET | ORAL | 2 refills | Status: DC

## 2018-09-02 ENCOUNTER — Encounter: Payer: Self-pay | Admitting: Family Medicine

## 2018-09-03 ENCOUNTER — Ambulatory Visit: Payer: Medicare Other | Admitting: Family Medicine

## 2018-09-03 ENCOUNTER — Telehealth: Payer: Self-pay | Admitting: Family Medicine

## 2018-09-03 ENCOUNTER — Other Ambulatory Visit: Payer: Self-pay | Admitting: Family Medicine

## 2018-09-03 DIAGNOSIS — F418 Other specified anxiety disorders: Secondary | ICD-10-CM

## 2018-09-03 MED ORDER — ALPRAZOLAM 1 MG PO TABS: 1.0000 mg | ORAL_TABLET | Freq: Three times a day (TID) | ORAL | 0 refills | Status: DC

## 2018-09-03 NOTE — Telephone Encounter (Signed)
Pt is wanting to know if Dr Livia Snellen can fill his xanax for one month until he can get in with the new psychiatrist. His pas psychiatrist that was writing the xanax lost his licenses and pt is needing a refill on xanax until he can get in with a new psychiatrist.     Pharmacy: [facility]

## 2018-09-03 NOTE — Telephone Encounter (Signed)
I sent in the requested prescription 

## 2018-09-04 ENCOUNTER — Telehealth: Payer: Self-pay | Admitting: Family Medicine

## 2018-09-04 NOTE — Telephone Encounter (Signed)
Patient aware.

## 2018-09-04 NOTE — Telephone Encounter (Signed)
Please refer as pt requests 

## 2018-09-04 NOTE — Telephone Encounter (Signed)
Pt aware NTBS for documentation purposes appt made for 09/21/18, cancelled 09/27/18 appt

## 2018-09-04 NOTE — Telephone Encounter (Signed)
Pt is calling asking if Dr. Livia Snellen will write an order for [facility] and fax to [facility] in [place]. Their fax number [phone].

## 2018-09-04 NOTE — Telephone Encounter (Signed)
Patient would like to know if Dr. Livia Snellen can send him to a new psychiarist ?

## 2018-09-05 NOTE — Telephone Encounter (Signed)
Left message to call back.  Placed referral for new psychiatrist.

## 2018-09-05 NOTE — Telephone Encounter (Signed)
Use depression with anxiety, it is on his problem list.  Thanks, WS.

## 2018-09-05 NOTE — Telephone Encounter (Signed)
What diagnosis should we use for the referral to a new psychiatrist?

## 2018-09-07 ENCOUNTER — Ambulatory Visit: Payer: Medicare Other | Admitting: Family Medicine

## 2018-09-21 ENCOUNTER — Ambulatory Visit: Payer: Medicare Other | Admitting: Family Medicine

## 2018-09-25 ENCOUNTER — Ambulatory Visit: Payer: Medicare Other | Admitting: Family Medicine

## 2018-09-27 ENCOUNTER — Ambulatory Visit: Payer: Medicare Other | Admitting: Family Medicine

## 2018-10-08 ENCOUNTER — Ambulatory Visit: Payer: Medicare Other | Admitting: Family Medicine

## 2018-11-12 ENCOUNTER — Telehealth: Payer: Self-pay | Admitting: Family Medicine

## 2018-11-12 NOTE — Telephone Encounter (Signed)
Patient states that he has always taken 200mg  of seroquel.  Patient didn't realize prescription was written for 100mg  one daily instead of 200mg  daily. So patient has been taking 200mg 

## 2018-11-12 NOTE — Telephone Encounter (Signed)
Pt is wanting to know if we can change the seroquel to 200 mg instead of the 100 mg

## 2018-11-12 NOTE — Telephone Encounter (Signed)
Please contact the patient to find out what the purpose is for increasing his dose.

## 2018-11-13 ENCOUNTER — Other Ambulatory Visit: Payer: Self-pay | Admitting: Family Medicine

## 2018-11-13 MED ORDER — QUETIAPINE FUMARATE 200 MG PO TABS: 200.0000 mg | ORAL_TABLET | Freq: Every day | ORAL | 0 refills | Status: DC

## 2018-11-13 NOTE — Telephone Encounter (Signed)
I sent in the requested prescription 

## 2018-11-13 NOTE — Telephone Encounter (Signed)
Patient aware.

## 2018-11-26 ENCOUNTER — Encounter (INDEPENDENT_AMBULATORY_CARE_PROVIDER_SITE_OTHER): Payer: Medicare Other | Admitting: Ophthalmology

## 2018-11-26 ENCOUNTER — Other Ambulatory Visit: Payer: Self-pay

## 2018-11-26 DIAGNOSIS — H2513 Age-related nuclear cataract, bilateral: Secondary | ICD-10-CM

## 2018-11-26 DIAGNOSIS — H353132 Nonexudative age-related macular degeneration, bilateral, intermediate dry stage: Secondary | ICD-10-CM

## 2018-11-26 DIAGNOSIS — H43813 Vitreous degeneration, bilateral: Secondary | ICD-10-CM

## 2018-12-05 ENCOUNTER — Other Ambulatory Visit: Payer: Self-pay | Admitting: Family Medicine

## 2018-12-05 ENCOUNTER — Other Ambulatory Visit: Payer: Self-pay

## 2018-12-10 ENCOUNTER — Ambulatory Visit (INDEPENDENT_AMBULATORY_CARE_PROVIDER_SITE_OTHER): Payer: Medicare Other | Admitting: Family Medicine

## 2018-12-10 ENCOUNTER — Encounter: Payer: Self-pay | Admitting: Family Medicine

## 2018-12-10 ENCOUNTER — Other Ambulatory Visit: Payer: Self-pay

## 2018-12-10 DIAGNOSIS — J300 Vasomotor rhinitis: Secondary | ICD-10-CM | POA: Diagnosis not present

## 2018-12-10 DIAGNOSIS — S88111D Complete traumatic amputation at level between knee and ankle, right lower leg, subsequent encounter: Secondary | ICD-10-CM

## 2018-12-10 DIAGNOSIS — E038 Other specified hypothyroidism: Secondary | ICD-10-CM

## 2018-12-10 DIAGNOSIS — G4701 Insomnia due to medical condition: Secondary | ICD-10-CM

## 2018-12-10 DIAGNOSIS — F418 Other specified anxiety disorders: Secondary | ICD-10-CM

## 2018-12-10 DIAGNOSIS — F11288 Opioid dependence with other opioid-induced disorder: Secondary | ICD-10-CM

## 2018-12-10 MED ORDER — ZOLPIDEM TARTRATE 5 MG PO TABS: 5.0000 mg | ORAL_TABLET | Freq: Every evening | ORAL | 5 refills | Status: DC | PRN

## 2018-12-10 MED ORDER — SILDENAFIL CITRATE 20 MG PO TABS: ORAL_TABLET | ORAL | 5 refills | Status: DC

## 2018-12-10 MED ORDER — RIVAROXABAN 20 MG PO TABS: 20.0000 mg | ORAL_TABLET | Freq: Every day | ORAL | 1 refills | Status: DC

## 2018-12-10 MED ORDER — AZELASTINE HCL 0.15 % NA SOLN: 2.0000 | Freq: Two times a day (BID) | NASAL | 5 refills | Status: DC

## 2018-12-10 MED ORDER — LEVOTHYROXINE SODIUM 50 MCG PO TABS: ORAL_TABLET | ORAL | 5 refills | Status: DC

## 2018-12-10 MED ORDER — QUETIAPINE FUMARATE 200 MG PO TABS: 200.0000 mg | ORAL_TABLET | Freq: Every day | ORAL | 1 refills | Status: DC

## 2018-12-10 MED ORDER — TRAZODONE HCL 150 MG PO TABS: ORAL_TABLET | ORAL | 5 refills | Status: DC

## 2018-12-10 MED ORDER — PREGABALIN 200 MG PO CAPS: 200.0000 mg | ORAL_CAPSULE | Freq: Every day | ORAL | 1 refills | Status: AC

## 2018-12-10 NOTE — Progress Notes (Addendum)
Subjective:  Patient ID: [name], male    DOB: December 28, 1960  Age: 58 y.o. MRN: [id number]  CC: No chief complaint on file.   HPI [name] presents for follow up of patient presents for follow-up on  thyroid. The patient has a history of hypothyroidism for many years. It has been stable recently. Pt. denies any change in  voice, loss of hair, heat or cold intolerance. Energy level has been adequate to good. Patient denies constipation and diarrhea. No myxedema. Medication is as noted below. Verified that pt is taking it daily on an empty stomach. Well tolerated.  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  He is having significant difficulty sleeping in spite of regular use of trazodone.Depression stable on current meds. PHQ not offically repeated today, but similar to results of 08/30/17 below.PRimary symptoms relate to loss of sleep.  Depression screen [facility] 2/9 08/30/2018 01/31/2018 04/25/2017  Decreased Interest 1 0 0  Down, Depressed, Hopeless 1 0 0  PHQ - 2 Score 2 0 0  Altered sleeping 3 - -  Tired, decreased energy 3 - -  Change in appetite 1 - -  Feeling bad or failure about yourself  0 - -  Trouble concentrating 1 - -  Moving slowly or fidgety/restless 0 - -  Suicidal thoughts 0 - -  PHQ-9 Score 10 - -    History [name] has a past medical history of Anxiety, Constipation, Depression, Hepatitis, History of DVT (deep vein thrombosis), Hypercholesterolemia, Hypothyroidism, and Unspecified venous (peripheral) insufficiency.   He has a past surgical history that includes Back surgery; Foot surgery (Right); Below knee leg amputation (Right); Colonoscopy with propofol (N/A, 06/26/2015); and polypectomy (06/26/2015).   His family history includes Arthritis in his brother; Cancer in his brother and father; Depression in his brother; Mental illness in his brother.He reports that he has been smoking. He has a 19.00 pack-year smoking  history. He has never used smokeless tobacco. He reports that he does not drink alcohol or use drugs.    ROS Review of Systems  Constitutional: Negative for fever.  Respiratory: Negative for shortness of breath.   Cardiovascular: Negative for chest pain.  Musculoskeletal: Negative for arthralgias.  Skin: Negative for rash.    Objective:  There were no vitals taken for this visit.  BP Readings from Last 3 Encounters:  08/30/18 95/67  01/31/18 114/76  04/25/17 112/81    Wt Readings from Last 3 Encounters:  08/30/18 207 lb 8 oz (94.1 kg)  01/31/18 191 lb 4 oz (86.8 kg)  04/25/17 206 lb (93.4 kg)     Physical Exam  Exam deferred. Pt. Harboring due to COVID 19. Phone visit performed.   Assessment & Plan:   Diagnoses and all orders for this visit:  Other specified hypothyroidism  Opioid dependence with other opioid-induced disorder ([place])  Depression with anxiety  Vasomotor rhinitis  Insomnia due to medical condition  Unilateral complete BKA, right, subsequent encounter ([place])  Other orders -     zolpidem (AMBIEN) 5 MG tablet; Take 1 tablet (5 mg total) by mouth at bedtime as needed for sleep. -     Azelastine HCl 0.15 % SOLN; Place 2 sprays into the nose 2 (two) times a day. -     levothyroxine (SYNTHROID) 50 MCG tablet; TAKE 1 TABLET ONCE A DAY BEFORE BREAKFAST. Needs labs -     pregabalin (LYRICA) 200 MG capsule; Take 1 capsule (200  mg total) by mouth daily. -     QUEtiapine (SEROQUEL) 200 MG tablet; Take 1 tablet (200 mg total) by mouth at bedtime. -     rivaroxaban (XARELTO) 20 MG TABS tablet; Take 1 tablet (20 mg total) by mouth daily with supper. -     sildenafil (REVATIO) 20 MG tablet; TAKE 2 TO 5 TABLETS DAILY AS NEEDED. -     traZODone (DESYREL) 150 MG tablet; Use from 1/3 to 1 tablet nightly as needed for sleep.       I have discontinued [name]. [name]'s ALPRAZolam. I have also changed his pregabalin. Additionally, I am having him start on zolpidem  and Azelastine HCl. Lastly, I am having him maintain his co-enzyme Q-10, B Complex-C (SUPER B COMPLEX PO), Cyanocobalamin (VITAMIN B12 PO), hydrOXYzine, oxyCODONE-acetaminophen, levothyroxine, QUEtiapine, rivaroxaban, sildenafil, and traZODone.  Allergies as of 12/10/2018   No Known Allergies     Medication List       Accurate as of December 10, 2018 10:56 PM. If you have any questions, ask your nurse or doctor.        STOP taking these medications   ALPRAZolam 1 MG tablet Commonly known as:  XANAX Stopped by:  Christopher Fraise, MD     TAKE these medications   Azelastine HCl 0.15 % Soln Place 2 sprays into the nose 2 (two) times a day. Started by:  Christopher Fraise, MD   co-enzyme Q-10 30 MG capsule Take 30 mg by mouth daily.   hydrOXYzine 50 MG tablet Commonly known as:  ATARAX/VISTARIL Take 50 mg by mouth at bedtime.   levothyroxine 50 MCG tablet Commonly known as:  SYNTHROID TAKE 1 TABLET ONCE A DAY BEFORE BREAKFAST. Needs labs   oxyCODONE-acetaminophen 10-325 MG tablet Commonly known as:  PERCOCET   pregabalin 200 MG capsule Commonly known as:  LYRICA Take 1 capsule (200 mg total) by mouth daily.   QUEtiapine 200 MG tablet Commonly known as:  SEROQUEL Take 1 tablet (200 mg total) by mouth at bedtime.   rivaroxaban 20 MG Tabs tablet Commonly known as:  XARELTO Take 1 tablet (20 mg total) by mouth daily with supper.   sildenafil 20 MG tablet Commonly known as:  REVATIO TAKE 2 TO 5 TABLETS DAILY AS NEEDED.   SUPER B COMPLEX PO Take by mouth.   traZODone 150 MG tablet Commonly known as:  DESYREL Use from 1/3 to 1 tablet nightly as needed for sleep.   VITAMIN B12 PO Take by mouth.   zolpidem 5 MG tablet Commonly known as:  AMBIEN Take 1 tablet (5 mg total) by mouth at bedtime as needed for sleep. Started by:  Christopher Fraise, MD      Virtual Visit via telephone Note  I discussed the limitations, risks, security and privacy concerns of performing an evaluation  and management service by telephone and the availability of in person appointments. I also discussed with the patient that there may be a patient responsible charge related to this service. The patient expressed understanding and agreed to proceed. Pt. Is at home. Dr. Livia Snellen is in his office.  Follow Up Instructions:   I discussed the assessment and treatment plan with the patient. The patient was provided an opportunity to ask questions and all were answered. The patient agreed with the plan and demonstrated an understanding of the instructions.   The patient was advised to call back or seek an in-person evaluation if the symptoms worsen or if the condition fails to improve as  anticipated.   Total minutes including chart review and phone contact time: 25   Follow-up: Return in about 6 months (around 06/11/2019).  [name], M.D.

## 2018-12-14 ENCOUNTER — Telehealth: Payer: Self-pay | Admitting: Family Medicine

## 2018-12-14 NOTE — Telephone Encounter (Signed)
Pt notified RX was faxed in

## 2018-12-14 NOTE — Telephone Encounter (Signed)
I sent the scrip by fax 3 days ago. (Per his direction at our phone visit) [place]

## 2018-12-14 NOTE — Telephone Encounter (Signed)
Please review and advise Does this need to be face to face appt?

## 2018-12-17 NOTE — Telephone Encounter (Signed)
I don't see a message?

## 2018-12-17 NOTE — Telephone Encounter (Signed)
Patient is needing prosthetic supplies

## 2018-12-18 NOTE — Telephone Encounter (Signed)
I wrote the scrip. Giving to [employer] to fax. Thanks, WS

## 2018-12-18 NOTE — Telephone Encounter (Signed)
Paperwork faxed °

## 2018-12-25 ENCOUNTER — Encounter (INDEPENDENT_AMBULATORY_CARE_PROVIDER_SITE_OTHER): Payer: Medicare Other | Admitting: Ophthalmology

## 2018-12-25 ENCOUNTER — Other Ambulatory Visit: Payer: Self-pay

## 2018-12-25 DIAGNOSIS — H2513 Age-related nuclear cataract, bilateral: Secondary | ICD-10-CM | POA: Diagnosis not present

## 2018-12-25 DIAGNOSIS — H353132 Nonexudative age-related macular degeneration, bilateral, intermediate dry stage: Secondary | ICD-10-CM | POA: Diagnosis not present

## 2018-12-25 DIAGNOSIS — H43813 Vitreous degeneration, bilateral: Secondary | ICD-10-CM | POA: Diagnosis not present

## 2018-12-31 ENCOUNTER — Telehealth: Payer: Self-pay | Admitting: Family Medicine

## 2018-12-31 ENCOUNTER — Other Ambulatory Visit: Payer: Self-pay | Admitting: Family Medicine

## 2018-12-31 MED ORDER — ZOLPIDEM TARTRATE 10 MG PO TABS: 10.0000 mg | ORAL_TABLET | Freq: Every evening | ORAL | 2 refills | Status: DC | PRN

## 2018-12-31 NOTE — Telephone Encounter (Signed)
I sent in the requested prescription 

## 2018-12-31 NOTE — Telephone Encounter (Signed)
Patient aware.

## 2019-01-23 ENCOUNTER — Telehealth: Payer: Self-pay | Admitting: Family Medicine

## 2019-01-23 NOTE — Telephone Encounter (Signed)
Please let him knoow that the diet pills are not compatible with several of his other medicines.

## 2019-01-24 NOTE — Telephone Encounter (Signed)
Pt notified of recommendation Verbalizes understanding 

## 2019-02-07 ENCOUNTER — Other Ambulatory Visit: Payer: Self-pay | Admitting: Family Medicine

## 2019-02-07 ENCOUNTER — Encounter: Payer: Self-pay | Admitting: Family Medicine

## 2019-02-07 ENCOUNTER — Ambulatory Visit (INDEPENDENT_AMBULATORY_CARE_PROVIDER_SITE_OTHER): Payer: Medicare Other | Admitting: Family Medicine

## 2019-02-07 DIAGNOSIS — R6889 Other general symptoms and signs: Secondary | ICD-10-CM

## 2019-02-07 DIAGNOSIS — J069 Acute upper respiratory infection, unspecified: Secondary | ICD-10-CM | POA: Diagnosis not present

## 2019-02-07 DIAGNOSIS — Z20822 Contact with and (suspected) exposure to covid-19: Secondary | ICD-10-CM

## 2019-02-07 MED ORDER — PSEUDOEPH-BROMPHEN-DM 30-2-10 MG/5ML PO SYRP: 2.5000 mL | ORAL_SOLUTION | Freq: Four times a day (QID) | ORAL | 0 refills | Status: DC | PRN

## 2019-02-07 NOTE — Progress Notes (Signed)
Virtual Visit via telephone Note Due to COVID-19 pandemic this visit was conducted virtually. This visit type was conducted due to national recommendations for restrictions regarding the COVID-19 Pandemic (e.g. social distancing, sheltering in place) in an effort to limit this patient's exposure and mitigate transmission in our community. All issues noted in this document were discussed and addressed.  A physical exam was not performed with this format.   I connected with [name] on 02/07/19 at [facility] by telephone and verified that I am speaking with the correct person using two identifiers. [name] 58 y.o. is currently located at home and family is currently with them during visit. The provider, Monia Pouch, FNP is located in their office at time of visit.  I discussed the limitations, risks, security and privacy concerns of performing an evaluation and management service by telephone and the availability of in person appointments. I also discussed with the patient that there may be a patient responsible charge related to this service. The patient expressed understanding and agreed to proceed.  Subjective:  Patient ID: [name], male    DOB: August 27, 1960, 58 y.o.   MRN: [id number]  Chief Complaint:  URI   HPI: LJ MIYAMOTO is a 58 y.o. male presenting on 02/07/2019 for URI   Pt c/o cough, congestion, malaise, and chills. Onset 5 days ago and not getting better. No recent travel. Unknown if exposed to ill contact.   URI  This is a new problem. The current episode started in the past 7 days. The problem has been gradually worsening. There has been no fever. Associated symptoms include congestion, coughing, headaches, rhinorrhea and a sore throat. Pertinent negatives include no abdominal pain, chest pain, diarrhea, dysuria, ear pain, joint pain, joint swelling, nausea, neck pain, plugged ear sensation, rash, sinus pain, sneezing, swollen glands, vomiting or wheezing. He has tried  decongestant for the symptoms. The treatment provided no relief.     Relevant past medical, surgical, family, and social history reviewed and updated as indicated.  Allergies and medications reviewed and updated.   Past Medical History:  Diagnosis Date  . Anxiety   . Constipation   . Depression   . Hepatitis    Hepatitis C  . History of DVT (deep vein thrombosis)   . Hypercholesterolemia   . Hypothyroidism   . Unspecified venous (peripheral) insufficiency     Past Surgical History:  Procedure Laterality Date  . BACK SURGERY    . BELOW KNEE LEG AMPUTATION Right   . COLONOSCOPY WITH PROPOFOL N/A 06/26/2015   Procedure: COLONOSCOPY WITH PROPOFOL;  Surgeon: Rogene Houston, MD;  Location: [facility];  Service: Endoscopy;  Laterality: N/A;  1010  . FOOT SURGERY Right   . POLYPECTOMY  06/26/2015   Procedure: POLYPECTOMY;  Surgeon: Rogene Houston, MD;  Location: [facility];  Service: Endoscopy;;  transverse colon polyps x2 (cold biopsy)    Social History   Socioeconomic History  . Marital status: Divorced    Spouse name: Not on file  . Number of children: Not on file  . Years of education: Not on file  . Highest education level: Not on file  Occupational History  . Not on file  Social Needs  . Financial resource strain: Not on file  . Food insecurity    Worry: Not on file    Inability: Not on file  . Transportation needs    Medical: Not on file    Non-medical: Not on file  Tobacco Use  . Smoking status: Current Every Day Smoker    Packs/day: 1.00    Years: 19.00    Pack years: 19.00  . Smokeless tobacco: Never Used  Substance and Sexual Activity  . Alcohol use: No  . Drug use: No  . Sexual activity: Never  Lifestyle  . Physical activity    Days per week: Not on file    Minutes per session: Not on file  . Stress: Not on file  Relationships  . Social Herbalist on phone: Not on file    Gets together: Not on file    Attends religious  service: Not on file    Active member of club or organization: Not on file    Attends meetings of clubs or organizations: Not on file    Relationship status: Not on file  . Intimate partner violence    Fear of current or ex partner: Not on file    Emotionally abused: Not on file    Physically abused: Not on file    Forced sexual activity: Not on file  Other Topics Concern  . Not on file  Social History Narrative  . Not on file    Outpatient Encounter Medications as of 02/07/2019  Medication Sig  . Azelastine HCl 0.15 % SOLN Place 2 sprays into the nose 2 (two) times a day.  . B Complex-C (SUPER B COMPLEX PO) Take by mouth.  . brompheniramine-pseudoephedrine-DM 30-2-10 MG/5ML syrup Take 2.5 mLs by mouth 4 (four) times daily as needed.  Marland Kitchen co-enzyme Q-10 30 MG capsule Take 30 mg by mouth daily.  . Cyanocobalamin (VITAMIN B12 PO) Take by mouth.  . hydrOXYzine (ATARAX/VISTARIL) 50 MG tablet Take 50 mg by mouth at bedtime.  Marland Kitchen levothyroxine (SYNTHROID) 25 MCG tablet   . levothyroxine (SYNTHROID) 50 MCG tablet TAKE 1 TABLET ONCE A DAY BEFORE BREAKFAST. Needs labs  . oxyCODONE-acetaminophen (PERCOCET) 10-325 MG tablet   . pregabalin (LYRICA) 200 MG capsule Take 1 capsule (200 mg total) by mouth daily.  . QUEtiapine (SEROQUEL) 200 MG tablet Take 1 tablet (200 mg total) by mouth at bedtime.  . rivaroxaban (XARELTO) 20 MG TABS tablet Take 1 tablet (20 mg total) by mouth daily with supper.  . sildenafil (REVATIO) 20 MG tablet TAKE 2 TO 5 TABLETS DAILY AS NEEDED.  Marland Kitchen traZODone (DESYREL) 150 MG tablet Use from 1/3 to 1 tablet nightly as needed for sleep.  Marland Kitchen zolpidem (AMBIEN) 10 MG tablet Take 1 tablet (10 mg total) by mouth at bedtime as needed for sleep.   No facility-administered encounter medications on file as of 02/07/2019.     No Known Allergies  Review of Systems  Constitutional: Positive for chills and fatigue. Negative for activity change, appetite change, diaphoresis, fever and  unexpected weight change.  HENT: Positive for congestion, rhinorrhea and sore throat. Negative for ear pain, sinus pain and sneezing.   Respiratory: Positive for cough. Negative for shortness of breath and wheezing.   Cardiovascular: Negative for chest pain and palpitations.  Gastrointestinal: Negative for abdominal pain, diarrhea, nausea and vomiting.  Genitourinary: Negative for decreased urine volume, difficulty urinating and dysuria.  Musculoskeletal: Positive for myalgias. Negative for arthralgias, joint pain and neck pain.  Skin: Negative for rash.  Neurological: Positive for headaches. Negative for dizziness, weakness and light-headedness.  Psychiatric/Behavioral: Negative for confusion.  All other systems reviewed and are negative.        Observations/Objective: No vital signs or physical exam, this was  a telephone or virtual health encounter.  Pt alert and oriented, answers all questions appropriately, and able to speak in full sentences.    Assessment and Plan: Rogerio was seen today for uri.  Diagnoses and all orders for this visit:  URI with cough and congestion Suspected 2019 Novel Coronavirus Infection Reported symptoms concerning for COVID-19 infection. Testing ordered and pt provided with testing site information. Symptomatic care discussed. Self quarantine discussed in detail, infection prevention discussed in detail. Report any new or worsening symptoms. Medications as prescribed.  -     brompheniramine-pseudoephedrine-DM 30-2-10 MG/5ML syrup; Take 2.5 mLs by mouth 4 (four) times daily as needed. -     Novel Coronavirus, NAA ([facility]); Future     Follow Up Instructions: Return if symptoms worsen or fail to improve.    I discussed the assessment and treatment plan with the patient. The patient was provided an opportunity to ask questions and all were answered. The patient agreed with the plan and demonstrated an understanding of the instructions.   The patient  was advised to call back or seek an in-person evaluation if the symptoms worsen or if the condition fails to improve as anticipated.  The above assessment and management plan was discussed with the patient. The patient verbalized understanding of and has agreed to the management plan. Patient is aware to call the clinic if symptoms persist or worsen. Patient is aware when to return to the clinic for a follow-up visit. Patient educated on when it is appropriate to go to the emergency department.    I provided 15 minutes of non-face-to-face time during this encounter. The call started at [place]. The call ended at 68. The other time was used for coordination of care.    Monia Pouch, FNP-C [facility] [address] [place], [place] [address] [phone] 02/07/19

## 2019-02-25 ENCOUNTER — Encounter (INDEPENDENT_AMBULATORY_CARE_PROVIDER_SITE_OTHER): Payer: Medicare Other | Admitting: Ophthalmology

## 2019-03-01 ENCOUNTER — Ambulatory Visit: Payer: Medicare Other | Admitting: Family Medicine

## 2019-03-06 ENCOUNTER — Encounter (INDEPENDENT_AMBULATORY_CARE_PROVIDER_SITE_OTHER): Payer: Medicare Other | Admitting: Ophthalmology

## 2019-03-21 ENCOUNTER — Encounter (INDEPENDENT_AMBULATORY_CARE_PROVIDER_SITE_OTHER): Payer: Medicare Other | Admitting: Ophthalmology

## 2019-03-26 ENCOUNTER — Other Ambulatory Visit: Payer: Self-pay | Admitting: Family Medicine

## 2019-04-15 ENCOUNTER — Telehealth: Payer: Self-pay | Admitting: Family Medicine

## 2019-04-15 NOTE — Telephone Encounter (Signed)
Patient has a follow up appointment scheduled. 

## 2019-04-17 ENCOUNTER — Other Ambulatory Visit: Payer: Self-pay

## 2019-04-17 ENCOUNTER — Telehealth (INDEPENDENT_AMBULATORY_CARE_PROVIDER_SITE_OTHER): Payer: Medicare Other | Admitting: Family Medicine

## 2019-04-17 DIAGNOSIS — F418 Other specified anxiety disorders: Secondary | ICD-10-CM

## 2019-04-17 MED ORDER — QUETIAPINE FUMARATE 300 MG PO TABS: 300.0000 mg | ORAL_TABLET | Freq: Every day | ORAL | 2 refills | Status: DC

## 2019-04-17 MED ORDER — ZOLPIDEM TARTRATE ER 12.5 MG PO TBCR: 12.5000 mg | EXTENDED_RELEASE_TABLET | Freq: Every evening | ORAL | 0 refills | Status: DC | PRN

## 2019-04-17 NOTE — Progress Notes (Signed)
Subjective:    Patient ID: [name], male    DOB: [birth year], 58 y.o.   MRN: [id number]   HPI: [name] is a 58 y.o. male presenting for worsening anxiety. Getting worried more. Feels irritable. Taaking seroquel 200 mg qhs now. Has taken 300 mg in the past. Would like to consider an increase.  Not sleeping well. Asks for an increase in the ambien to 12.5 mg. He has been off of his XAnax since April (confirmed in [place])   Depression screen [facility] 2/9 08/30/2018 01/31/2018 04/25/2017 04/13/2017 03/31/2017  Decreased Interest 1 0 0 0 0  Down, Depressed, Hopeless 1 0 0 0 0  PHQ - 2 Score 2 0 0 0 0  Altered sleeping 3 - - - -  Tired, decreased energy 3 - - - -  Change in appetite 1 - - - -  Feeling bad or failure about yourself  0 - - - -  Trouble concentrating 1 - - - -  Moving slowly or fidgety/restless 0 - - - -  Suicidal thoughts 0 - - - -  PHQ-9 Score 10 - - - -     Relevant past medical, surgical, family and social history reviewed and updated as indicated.  Interim medical history since our last visit reviewed. Allergies and medications reviewed and updated.  ROS:  Review of Systems  Constitutional: Negative for fever.  Respiratory: Negative for shortness of breath.   Cardiovascular: Negative for chest pain.  Musculoskeletal: Negative for arthralgias.  Skin: Negative for rash.  Psychiatric/Behavioral: Positive for agitation, dysphoric mood and sleep disturbance. The patient is nervous/anxious.      Social History   Tobacco Use  Smoking Status Current Every Day Smoker  . Packs/day: 1.00  . Years: 19.00  . Pack years: 19.00  Smokeless Tobacco Never Used       Objective:     Wt Readings from Last 3 Encounters:  08/30/18 207 lb 8 oz (94.1 kg)  01/31/18 191 lb 4 oz (86.8 kg)  04/25/17 206 lb (93.4 kg)     Exam deferred. Pt. Harboring due to COVID 19. Phone visit performed.   Assessment & Plan:   1. Depression with anxiety     Meds ordered this  encounter  Medications  . QUEtiapine (SEROQUEL) 300 MG tablet    Sig: Take 1 tablet (300 mg total) by mouth at bedtime.    Dispense:  30 tablet    Refill:  2  . zolpidem (AMBIEN CR) 12.5 MG CR tablet    Sig: Take 1 tablet (12.5 mg total) by mouth at bedtime as needed for sleep.    Dispense:  30 tablet    Refill:  0    This replaces current scrip for zolpidem 10 mg. Please cancel the two refills remaining of that medication. Thanks, WS        Diagnoses and all orders for this visit:  Depression with anxiety  Other orders -     QUEtiapine (SEROQUEL) 300 MG tablet; Take 1 tablet (300 mg total) by mouth at bedtime. -     zolpidem (AMBIEN CR) 12.5 MG CR tablet; Take 1 tablet (12.5 mg total) by mouth at bedtime as needed for sleep.    Virtual Visit via telephone Note  I discussed the limitations, risks, security and privacy concerns of performing an evaluation and management service by telephone and the availability of in person appointments. The patient was identified with two identifiers. Pt.expressed understanding and agreed  to proceed. Pt. Is at home. Dr. Livia Snellen is in his office.  Follow Up Instructions:   I discussed the assessment and treatment plan with the patient. The patient was provided an opportunity to ask questions and all were answered. The patient agreed with the plan and demonstrated an understanding of the instructions.   The patient was advised to call back or seek an in-person evaluation if the symptoms worsen or if the condition fails to improve as anticipated.   Total minutes including chart review and phone contact time: 27   Follow up plan: As previously planned, and as needed should symptoms worsen.  Claretta Fraise, MD [place]

## 2019-04-19 ENCOUNTER — Ambulatory Visit: Payer: Medicare Other | Admitting: Family Medicine

## 2019-04-24 ENCOUNTER — Encounter (INDEPENDENT_AMBULATORY_CARE_PROVIDER_SITE_OTHER): Payer: Medicare Other | Admitting: Ophthalmology

## 2019-05-09 ENCOUNTER — Encounter (INDEPENDENT_AMBULATORY_CARE_PROVIDER_SITE_OTHER): Payer: Medicare Other | Admitting: Ophthalmology

## 2019-05-14 ENCOUNTER — Other Ambulatory Visit: Payer: Self-pay | Admitting: Family Medicine

## 2019-05-15 ENCOUNTER — Encounter (INDEPENDENT_AMBULATORY_CARE_PROVIDER_SITE_OTHER): Payer: Medicare Other | Admitting: Ophthalmology

## 2019-05-15 ENCOUNTER — Other Ambulatory Visit: Payer: Self-pay | Admitting: Family Medicine

## 2019-05-15 DIAGNOSIS — H43813 Vitreous degeneration, bilateral: Secondary | ICD-10-CM | POA: Diagnosis not present

## 2019-05-15 DIAGNOSIS — H353122 Nonexudative age-related macular degeneration, left eye, intermediate dry stage: Secondary | ICD-10-CM | POA: Diagnosis not present

## 2019-05-15 DIAGNOSIS — H2513 Age-related nuclear cataract, bilateral: Secondary | ICD-10-CM

## 2019-05-15 NOTE — Telephone Encounter (Signed)
Please advise on RF. Had televisit

## 2019-05-16 ENCOUNTER — Other Ambulatory Visit: Payer: Self-pay | Admitting: Family Medicine

## 2019-05-17 ENCOUNTER — Telehealth: Payer: Self-pay | Admitting: Family Medicine

## 2019-05-17 NOTE — Telephone Encounter (Signed)
Patient can not drive and does not have a ride here. Dr. Livia Snellen wrote his ambein  Last month. He runs out tomorrow asking for a refill until [facility] comes back? Told would send to a covering provider.

## 2019-05-18 ENCOUNTER — Other Ambulatory Visit: Payer: Self-pay | Admitting: Family Medicine

## 2019-05-20 ENCOUNTER — Other Ambulatory Visit: Payer: Self-pay | Admitting: Family Medicine

## 2019-05-20 NOTE — Telephone Encounter (Signed)
Please set him up for phone visit tomorrow. WS

## 2019-05-20 NOTE — Telephone Encounter (Signed)
Appt made

## 2019-05-21 ENCOUNTER — Ambulatory Visit (INDEPENDENT_AMBULATORY_CARE_PROVIDER_SITE_OTHER): Payer: Medicare Other | Admitting: Family Medicine

## 2019-05-21 ENCOUNTER — Encounter: Payer: Self-pay | Admitting: Family Medicine

## 2019-05-21 DIAGNOSIS — G546 Phantom limb syndrome with pain: Secondary | ICD-10-CM | POA: Diagnosis not present

## 2019-05-21 DIAGNOSIS — G4701 Insomnia due to medical condition: Secondary | ICD-10-CM | POA: Diagnosis not present

## 2019-05-21 MED ORDER — ZOLPIDEM TARTRATE ER 12.5 MG PO TBCR: 12.5000 mg | EXTENDED_RELEASE_TABLET | Freq: Every evening | ORAL | 0 refills | Status: DC | PRN

## 2019-05-21 NOTE — Progress Notes (Signed)
Subjective:    Patient ID: [name], male    DOB: 04-09-1961, 58 y.o.   MRN: [id number]   HPI: [name] is a 58 y.o. male presenting for zolpidem CR working very well - sleeping well. Sleeps like a baby for 9 hours . Without it up at 3:30 Am and very nervous and tired all day. Ran out 3 days ago, so very nervous now. Regular zolpidem got him to sleep fast, but still ahd early wakening after 4-5 hours. PDMP review shows no irregularities   Depression screen [facility] 2/9 08/30/2018 01/31/2018 04/25/2017 04/13/2017 03/31/2017  Decreased Interest 1 0 0 0 0  Down, Depressed, Hopeless 1 0 0 0 0  PHQ - 2 Score 2 0 0 0 0  Altered sleeping 3 - - - -  Tired, decreased energy 3 - - - -  Change in appetite 1 - - - -  Feeling bad or failure about yourself  0 - - - -  Trouble concentrating 1 - - - -  Moving slowly or fidgety/restless 0 - - - -  Suicidal thoughts 0 - - - -  PHQ-9 Score 10 - - - -     Relevant past medical, surgical, family and social history reviewed and updated as indicated.  Interim medical history since our last visit reviewed. Allergies and medications reviewed and updated.  ROS:  Review of Systems  Constitutional: Negative for activity change and fever.  HENT: Negative.   Cardiovascular: Negative for chest pain.  Musculoskeletal: Positive for arthralgias and back pain.  Psychiatric/Behavioral: Positive for sleep disturbance.     Social History   Tobacco Use  Smoking Status Current Every Day Smoker  . Packs/day: 1.00  . Years: 19.00  . Pack years: 19.00  Smokeless Tobacco Never Used       Objective:     Wt Readings from Last 3 Encounters:  08/30/18 207 lb 8 oz (94.1 kg)  01/31/18 191 lb 4 oz (86.8 kg)  04/25/17 206 lb (93.4 kg)     Exam deferred. Pt. Harboring due to COVID 19. Phone visit performed.   Assessment & Plan:   1. Insomnia due to medical condition   2. 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 zolpidem (AMBIEN CR) 12.5 MG CR tablet    Sig: Take 1 tablet (12.5 mg total) by mouth at bedtime as needed for sleep.    Dispense:  90 tablet    Refill:  0    This replaces current scrip for zolpidem 10 mg. Please cancel the two refills remaining of that medication. Thanks, WS    No orders of the defined types were placed in this encounter.     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    Virtual Visit via telephone Note  I discussed the limitations, risks, security and privacy concerns of performing an evaluation and management service by telephone and the availability of in person appointments. The patient was identified with two identifiers. Pt.expressed understanding and agreed to proceed. Pt. Is at home. Dr. Livia Snellen is in his office.  Follow Up Instructions:   I discussed the assessment and treatment plan with the  patient. The patient was provided an opportunity to ask questions and all were answered. The patient agreed with the plan and demonstrated an understanding of the instructions.   The patient was advised to call back or seek an in-person evaluation if the symptoms worsen or if the condition fails to improve as anticipated.   Total minutes including chart review and phone contact time: 12   Follow up plan: No follow-ups on file.  Claretta Fraise, MD [place]

## 2019-06-08 ENCOUNTER — Other Ambulatory Visit: Payer: Self-pay | Admitting: Family Medicine

## 2019-06-26 ENCOUNTER — Encounter: Payer: Self-pay | Admitting: Family Medicine

## 2019-06-26 ENCOUNTER — Ambulatory Visit (INDEPENDENT_AMBULATORY_CARE_PROVIDER_SITE_OTHER): Payer: Medicare Other | Admitting: Family Medicine

## 2019-06-26 DIAGNOSIS — J302 Other seasonal allergic rhinitis: Secondary | ICD-10-CM | POA: Diagnosis not present

## 2019-06-26 MED ORDER — QUETIAPINE FUMARATE 300 MG PO TABS: 300.0000 mg | ORAL_TABLET | Freq: Every day | ORAL | 2 refills | Status: DC

## 2019-06-26 MED ORDER — RIVAROXABAN 20 MG PO TABS: 20.0000 mg | ORAL_TABLET | Freq: Every day | ORAL | 1 refills | Status: DC

## 2019-06-26 MED ORDER — TRAZODONE HCL 150 MG PO TABS: ORAL_TABLET | ORAL | 5 refills | Status: DC

## 2019-06-26 MED ORDER — FEXOFENADINE-PSEUDOEPHED ER 180-240 MG PO TB24: 1.0000 | ORAL_TABLET | Freq: Every evening | ORAL | 11 refills | Status: DC

## 2019-06-26 MED ORDER — LEVOTHYROXINE SODIUM 50 MCG PO TABS: ORAL_TABLET | ORAL | 5 refills | Status: DC

## 2019-06-26 NOTE — Progress Notes (Signed)
Subjective:    Patient ID: [name], male    DOB: 27-Jun-1961, 58 y.o.   MRN: [id number]   HPI: [name] is a 58 y.o. male presenting for sinus symptoms. Ongoing for 2-3 months. Runny nose. Blowing his nose frequently. Has to sleep sitting up. Denies fever. Denies PND. Denies congestion. No cough or sneezing. No dyspnea or NVD.   Having cataract surgery next week. Second one a month later. Was told not to use nasal sprays in the meantime. Also, due to frequent trips to the doctor for this, he won't be able to come here too. Needs refills on several medicines until February.   Depression screen [facility] 2/9 08/30/2018 01/31/2018 04/25/2017 04/13/2017 03/31/2017  Decreased Interest 1 0 0 0 0  Down, Depressed, Hopeless 1 0 0 0 0  PHQ - 2 Score 2 0 0 0 0  Altered sleeping 3 - - - -  Tired, decreased energy 3 - - - -  Change in appetite 1 - - - -  Feeling bad or failure about yourself  0 - - - -  Trouble concentrating 1 - - - -  Moving slowly or fidgety/restless 0 - - - -  Suicidal thoughts 0 - - - -  PHQ-9 Score 10 - - - -     Relevant past medical, surgical, family and social history reviewed and updated as indicated.  Interim medical history since our last visit reviewed. Allergies and medications reviewed and updated.  ROS:  Review of Systems  Constitutional: Negative for activity change, appetite change, chills and fever.  HENT: Positive for rhinorrhea. Negative for congestion, ear discharge, ear pain, hearing loss, nosebleeds, postnasal drip, sinus pressure, sneezing and trouble swallowing.   Respiratory: Negative for chest tightness and shortness of breath.   Cardiovascular: Negative for chest pain and palpitations.  Gastrointestinal: Negative for abdominal pain, nausea and vomiting.  Skin: Negative for rash.     Social History   Tobacco Use  Smoking Status Current Every Day Smoker  . Packs/day: 1.00  . Years: 19.00  . Pack years: 19.00  Smokeless Tobacco Never  Used       Objective:     Wt Readings from Last 3 Encounters:  08/30/18 207 lb 8 oz (94.1 kg)  01/31/18 191 lb 4 oz (86.8 kg)  04/25/17 206 lb (93.4 kg)     Exam deferred. Pt. Harboring due to COVID 19. Phone visit performed.   Assessment & Plan:   1. Seasonal allergic rhinitis, unspecified trigger     Meds ordered this encounter  Medications  . traZODone (DESYREL) 150 MG tablet    Sig: Use from 1/3 to 1 tablet nightly as needed for sleep.    Dispense:  30 tablet    Refill:  5  . rivaroxaban (XARELTO) 20 MG TABS tablet    Sig: Take 1 tablet (20 mg total) by mouth daily with supper.    Dispense:  90 tablet    Refill:  1  . QUEtiapine (SEROQUEL) 300 MG tablet    Sig: Take 1 tablet (300 mg total) by mouth at bedtime.    Dispense:  30 tablet    Refill:  2  . levothyroxine (SYNTHROID) 50 MCG tablet    Sig: TAKE 1 TABLET ONCE A DAY BEFORE BREAKFAST. Needs labs    Dispense:  30 tablet    Refill:  5  . fexofenadine-pseudoephedrine (ALLEGRA-D 24) 180-240 MG 24 hr tablet    Sig: Take 1 tablet  by mouth every evening. For allergy and congestion    Dispense:  30 tablet    Refill:  11    Refills to last until he is past the cataract surgery given above.    Diagnoses and all orders for this visit:  Seasonal allergic rhinitis, unspecified trigger  Other orders -     traZODone (DESYREL) 150 MG tablet; Use from 1/3 to 1 tablet nightly as needed for sleep. -     rivaroxaban (XARELTO) 20 MG TABS tablet; Take 1 tablet (20 mg total) by mouth daily with supper. -     QUEtiapine (SEROQUEL) 300 MG tablet; Take 1 tablet (300 mg total) by mouth at bedtime. -     levothyroxine (SYNTHROID) 50 MCG tablet; TAKE 1 TABLET ONCE A DAY BEFORE BREAKFAST. Needs labs -     fexofenadine-pseudoephedrine (ALLEGRA-D 24) 180-240 MG 24 hr tablet; Take 1 tablet by mouth every evening. For allergy and congestion    Virtual Visit via telephone Note  I discussed the limitations, risks, security and  privacy concerns of performing an evaluation and management service by telephone and the availability of in person appointments. The patient was identified with two identifiers. Pt.expressed understanding and agreed to proceed. Pt. Is at home. Dr. Livia Snellen is in his office.  Follow Up Instructions:   I discussed the assessment and treatment plan with the patient. The patient was provided an opportunity to ask questions and all were answered. The patient agreed with the plan and demonstrated an understanding of the instructions.   The patient was advised to call back or seek an in-person evaluation if the symptoms worsen or if the condition fails to improve as anticipated.   Total minutes including chart review and phone contact time: 18   Follow up plan: Return in about 6 weeks (around 08/07/2019) for Hypothyroidism, Anxiety, insomnia.  Claretta Fraise, MD [place]

## 2019-07-08 ENCOUNTER — Telehealth: Payer: Self-pay | Admitting: Family Medicine

## 2019-07-30 ENCOUNTER — Other Ambulatory Visit: Payer: Self-pay

## 2019-07-31 ENCOUNTER — Ambulatory Visit: Payer: Medicare Other | Admitting: Family Medicine

## 2019-07-31 ENCOUNTER — Encounter: Payer: Self-pay | Admitting: Family Medicine

## 2019-07-31 VITALS — BP 88/66 | HR 102 | Temp 96.8°F | Ht 68.0 in | Wt 214.0 lb

## 2019-07-31 DIAGNOSIS — N529 Male erectile dysfunction, unspecified: Secondary | ICD-10-CM | POA: Diagnosis not present

## 2019-07-31 DIAGNOSIS — E559 Vitamin D deficiency, unspecified: Secondary | ICD-10-CM

## 2019-07-31 DIAGNOSIS — Z125 Encounter for screening for malignant neoplasm of prostate: Secondary | ICD-10-CM

## 2019-07-31 DIAGNOSIS — F418 Other specified anxiety disorders: Secondary | ICD-10-CM

## 2019-07-31 DIAGNOSIS — J301 Allergic rhinitis due to pollen: Secondary | ICD-10-CM

## 2019-07-31 DIAGNOSIS — G4701 Insomnia due to medical condition: Secondary | ICD-10-CM

## 2019-07-31 DIAGNOSIS — I82401 Acute embolism and thrombosis of unspecified deep veins of right lower extremity: Secondary | ICD-10-CM

## 2019-07-31 DIAGNOSIS — G546 Phantom limb syndrome with pain: Secondary | ICD-10-CM

## 2019-07-31 DIAGNOSIS — E7800 Pure hypercholesterolemia, unspecified: Secondary | ICD-10-CM | POA: Diagnosis not present

## 2019-07-31 DIAGNOSIS — F11288 Opioid dependence with other opioid-induced disorder: Secondary | ICD-10-CM

## 2019-07-31 DIAGNOSIS — E038 Other specified hypothyroidism: Secondary | ICD-10-CM

## 2019-07-31 MED ORDER — LEVOTHYROXINE SODIUM 50 MCG PO TABS: ORAL_TABLET | ORAL | 5 refills | Status: DC

## 2019-07-31 MED ORDER — AZELASTINE HCL 0.15 % NA SOLN: 2.0000 | Freq: Two times a day (BID) | NASAL | 5 refills | Status: AC

## 2019-07-31 MED ORDER — SILDENAFIL CITRATE 20 MG PO TABS: ORAL_TABLET | ORAL | 5 refills | Status: AC

## 2019-07-31 MED ORDER — TRAZODONE HCL 150 MG PO TABS: ORAL_TABLET | ORAL | 5 refills | Status: DC

## 2019-07-31 MED ORDER — ZOLPIDEM TARTRATE ER 12.5 MG PO TBCR: 12.5000 mg | EXTENDED_RELEASE_TABLET | Freq: Every evening | ORAL | 0 refills | Status: DC | PRN

## 2019-07-31 MED ORDER — QUETIAPINE FUMARATE 300 MG PO TABS: 300.0000 mg | ORAL_TABLET | Freq: Every day | ORAL | 2 refills | Status: DC

## 2019-07-31 MED ORDER — RIVAROXABAN 20 MG PO TABS: 20.0000 mg | ORAL_TABLET | Freq: Every day | ORAL | 1 refills | Status: DC

## 2019-07-31 NOTE — Progress Notes (Signed)
Subjective:  Patient ID: [name], male    DOB: Mar 12, 1961  Age: 59 y.o. MRN: [id number]  CC: Hyperlipidemia, Hypothyroidism, and Medical Management of Chronic Issues   HPI [name] presents for  follow-up on  thyroid. The patient has a history of hypothyroidism for many years. It has been stable recently. Pt. denies any change in  voice, loss of hair, heat or cold intolerance. Energy level has been adequate to good. Patient denies constipation and diarrhea. No myxedema. Medication is as noted below. Verified that pt is taking it daily on an empty stomach. Well tolerated.  History of high cholesterol in the past currently not treated.  Presents today for reevaluation for potentially going back on the medication.  Additionally he takes anticoagulation medication rivaroxaban patient denies any recent bouts of chest pain or palpitations. Additionally, patient is taking anticoagulants. Patient denies any recent excessive bleeding episodes including epistaxis, bleeding from the gums, genitalia, rectal bleeding or hematuria. Additionally there has been no excessive bruising.  His anxiety is under good control he tells me today PHQ score below is normal.  He has slowly been weaned off of Xanax over time and is maintained primarily on Seroquel.  This also helps him with sleep along with his other insomnia medications Ambien and trazodone.  He also needs refills on the Atarax.  Patient has some ongoing allergic rhinitis symptoms which are relieved by medications noted below.  He is followed by Dr. [facility] pain clinic in [place] for his phantom pain.  He continues to use oxycodone and Lyrica for that. Depression screen [facility] 2/9 07/31/2019 08/30/2018 01/31/2018  Decreased Interest 0 1 0  Down, Depressed, Hopeless 0 1 0  PHQ - 2 Score 0 2 0  Altered sleeping 0 3 -  Tired, decreased energy 0 3 -  Change in appetite 0 1 -  Feeling bad or failure about yourself  0 0 -  Trouble concentrating 0 1  -  Moving slowly or fidgety/restless 0 0 -  Suicidal thoughts 0 0 -  PHQ-9 Score 0 10 -    History [name] has a past medical history of Anxiety, Constipation, Depression, Hepatitis, History of DVT (deep vein thrombosis), Hypercholesterolemia, Hypothyroidism, and Unspecified venous (peripheral) insufficiency.   He has a past surgical history that includes Back surgery; Foot surgery (Right); Below knee leg amputation (Right); Colonoscopy with propofol (N/A, 06/26/2015); and polypectomy (06/26/2015).   His family history includes Arthritis in his brother; Cancer in his brother and father; Depression in his brother; Mental illness in his brother.He reports that he has been smoking. He has a 19.00 pack-year smoking history. He has never used smokeless tobacco. He reports that he does not drink alcohol or use drugs.    ROS Review of Systems  Constitutional: Negative.   HENT: Negative.   Eyes: Negative for visual disturbance.  Respiratory: Negative for cough and shortness of breath.   Cardiovascular: Negative for chest pain and leg swelling.  Gastrointestinal: Negative for abdominal pain, diarrhea, nausea and vomiting.  Genitourinary: Negative for difficulty urinating.  Musculoskeletal: Positive for arthralgias (phantom pain, R BKA). Negative for myalgias.  Skin: Negative for rash.  Neurological: Negative for headaches.  Psychiatric/Behavioral: Negative for sleep disturbance.    Objective:  BP (!) 88/66   Pulse (!) 102   Temp (!) 96.8 F (36 C) (Temporal)   Ht '5\' 8"'$  (1.727 m)   Wt 214 lb (97.1 kg)   SpO2 95%   BMI 32.54 kg/m   BP  Readings from Last 3 Encounters:  07/31/19 (!) 88/66  08/30/18 95/67  01/31/18 114/76    Wt Readings from Last 3 Encounters:  07/31/19 214 lb (97.1 kg)  08/30/18 207 lb 8 oz (94.1 kg)  01/31/18 191 lb 4 oz (86.8 kg)     Physical Exam Constitutional:      General: He is not in acute distress.    Appearance: He is well-developed.  HENT:      Head: Normocephalic and atraumatic.     Right Ear: External ear normal.     Left Ear: External ear normal.     Nose: Nose normal.  Eyes:     Conjunctiva/sclera: Conjunctivae normal.     Pupils: Pupils are equal, round, and reactive to light.  Cardiovascular:     Rate and Rhythm: Normal rate and regular rhythm.     Heart sounds: Normal heart sounds. No murmur.  Pulmonary:     Effort: Pulmonary effort is normal. No respiratory distress.     Breath sounds: Normal breath sounds. No wheezing or rales.  Abdominal:     Palpations: Abdomen is soft.     Tenderness: There is no abdominal tenderness.  Musculoskeletal:        General: Normal range of motion.     Cervical back: Normal range of motion and neck supple.  Skin:    General: Skin is warm and dry.  Neurological:     Mental Status: He is alert and oriented to person, place, and time.     Deep Tendon Reflexes: Reflexes are normal and symmetric.  Psychiatric:        Behavior: Behavior normal.        Thought Content: Thought content normal.        Judgment: Judgment normal.       Assessment & Plan:   [name] was seen today for hyperlipidemia, hypothyroidism and medical management of chronic issues.  Diagnoses and all orders for this visit:  Other specified hypothyroidism -     levothyroxine (SYNTHROID) 50 MCG tablet; TAKE 1 TABLET ONCE A DAY BEFORE BREAKFAST. Needs labs -     CBC with Differential/Platelet -     CMP14+EGFR -     TSH + free T4  High cholesterol -     CMP14+EGFR -     Lipid panel  Acute deep vein thrombosis (DVT) of right lower extremity, unspecified vein (HCC) -     rivaroxaban (XARELTO) 20 MG TABS tablet; Take 1 tablet (20 mg total) by mouth daily with supper. 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 -     CBC with Differential/Platelet -     CMP14+EGFR  Opioid dependence with other opioid-induced disorder (HCC) -     CBC with Differential/Platelet -     CMP14+EGFR  Insomnia due to medical condition -     traZODone (DESYREL) 150 MG tablet; Use from 1/3 to 1 tablet nightly as needed for sleep. -  zolpidem (AMBIEN CR) 12.5 MG CR tablet; Take 1 tablet (12.5 mg total) by mouth at bedtime as needed for sleep. -     CBC with Differential/Platelet -     CMP14+EGFR  Seasonal allergic rhinitis due to pollen -     Azelastine HCl 0.15 % SOLN; Place 2 sprays into the nose 2 (two) times daily. -     CBC with Differential/Platelet -     CMP14+EGFR  Screening for prostate cancer -     PSA Total (Reflex To Free)  Vitamin D deficiency -     VITAMIN D 25 Hydroxy (Vit-D Deficiency, Fractures)       I have discontinued Waynette Buttery. [place]'s fexofenadine-pseudoephedrine. I have also changed his Azelastine HCl and sildenafil. Additionally, I am having him maintain his co-enzyme Q-10, B Complex-C (SUPER B COMPLEX PO), Cyanocobalamin (VITAMIN B12 PO), hydrOXYzine, oxyCODONE-acetaminophen, pregabalin, brompheniramine-pseudoephedrine-DM, brimonidine, atropine, ketorolac, ofloxacin, prednisoLONE acetate, levothyroxine, rivaroxaban, QUEtiapine, traZODone, and zolpidem.  Allergies as of 07/31/2019   No Known Allergies     Medication List       Accurate as of July 31, 2019  1:45 PM. If you have any questions, ask your nurse or doctor.        STOP taking these medications   fexofenadine-pseudoephedrine 180-240 MG 24 hr tablet Commonly known as: ALLEGRA-D 24 Stopped by: Claretta Fraise, MD     TAKE these medications   atropine 1 % ophthalmic solution    Azelastine HCl 0.15 % Soln Place 2 sprays into the nose 2 (two) times daily. What changed: when to take this Changed by: Claretta Fraise, MD   brimonidine 0.2 % ophthalmic solution Commonly known as: ALPHAGAN   brompheniramine-pseudoephedrine-DM 30-2-10 MG/5ML syrup Take 2.5 mLs by mouth 4 (four) times daily as needed.   co-enzyme Q-10 30 MG capsule Take 30 mg by mouth daily.   hydrOXYzine 50 MG tablet Commonly known as: ATARAX/VISTARIL Take 50 mg by mouth at bedtime.   ketorolac 0.5 % ophthalmic solution Commonly known as: ACULAR   levothyroxine 50 MCG tablet Commonly known as: SYNTHROID TAKE 1 TABLET ONCE A DAY BEFORE BREAKFAST. Needs labs   ofloxacin 0.3 % ophthalmic solution Commonly known as: OCUFLOX   oxyCODONE-acetaminophen 10-325 MG tablet Commonly known as: PERCOCET   prednisoLONE acetate 1 % ophthalmic suspension Commonly known as: PRED FORTE   pregabalin 200 MG capsule Commonly known as: LYRICA Take 1 capsule (200 mg total) by mouth daily.   QUEtiapine 300 MG tablet Commonly known as: SEROquel Take 1 tablet (300 mg total) by mouth at bedtime.   rivaroxaban 20 MG Tabs tablet Commonly known as: XARELTO Take 1 tablet (20 mg total) by mouth daily with supper.   sildenafil 20 MG tablet Commonly known as: REVATIO 2-5  Once a day as needed for intimacy What changed: See the new instructions. Changed by: Claretta Fraise, MD   SUPER B COMPLEX PO Take by mouth.   traZODone 150 MG tablet Commonly known as: DESYREL Use from 1/3 to 1 tablet nightly as needed for sleep.   VITAMIN B12 PO Take by mouth.   zolpidem 12.5 MG CR tablet Commonly known as: Ambien CR Take 1 tablet (12.5 mg total) by mouth at bedtime as needed for sleep.        Follow-up: Return in about 6 months (around 01/28/2020).  Claretta Fraise, M.D.

## 2019-08-01 ENCOUNTER — Other Ambulatory Visit: Payer: Self-pay | Admitting: *Deleted

## 2019-08-01 LAB — LIPID PANEL
Chol/HDL Ratio: 4.4 ratio (ref 0.0–5.0)
Cholesterol, Total: 170 mg/dL (ref 100–199)
HDL: 39 mg/dL — ABNORMAL LOW (ref >39)
LDL Chol Calc (NIH): 115 mg/dL — ABNORMAL HIGH (ref 0–99)
Triglycerides: 87 mg/dL (ref 0–149)
VLDL Cholesterol Cal: 16 mg/dL (ref 5–40)

## 2019-08-01 LAB — CMP14+EGFR
ALT: 15 IU/L (ref 0–44)
AST: 21 IU/L (ref 0–40)
Albumin/Globulin Ratio: 1.4 (ref 1.2–2.2)
Albumin: 3.6 g/dL — ABNORMAL LOW (ref 3.8–4.9)
Alkaline Phosphatase: 71 IU/L (ref 39–117)
BUN/Creatinine Ratio: 7 — ABNORMAL LOW (ref 9–20)
BUN: 7 mg/dL (ref 6–24)
Bilirubin Total: 0.3 mg/dL (ref 0.0–1.2)
CO2: 28 mmol/L (ref 20–29)
Calcium: 9 mg/dL (ref 8.7–10.2)
Chloride: 100 mmol/L (ref 96–106)
Creatinine, Ser: 1.05 mg/dL (ref 0.76–1.27)
GFR calc Af Amer: 90 mL/min/{1.73_m2} (ref >59)
GFR calc non Af Amer: 78 mL/min/{1.73_m2} (ref >59)
Globulin, Total: 2.6 g/dL (ref 1.5–4.5)
Glucose: 93 mg/dL (ref 65–99)
Potassium: 4.7 mmol/L (ref 3.5–5.2)
Sodium: 136 mmol/L (ref 134–144)
Total Protein: 6.2 g/dL (ref 6.0–8.5)

## 2019-08-01 LAB — CBC WITH DIFFERENTIAL/PLATELET
Basophils Absolute: 0 10*3/uL (ref 0.0–0.2)
Basos: 0 %
EOS (ABSOLUTE): 0.1 10*3/uL (ref 0.0–0.4)
Eos: 1 %
Hematocrit: 39 % (ref 37.5–51.0)
Hemoglobin: 13 g/dL (ref 13.0–17.7)
Immature Grans (Abs): 0 10*3/uL (ref 0.0–0.1)
Immature Granulocytes: 0 %
Lymphocytes Absolute: 2.8 10*3/uL (ref 0.7–3.1)
Lymphs: 52 %
MCH: 31.7 pg (ref 26.6–33.0)
MCHC: 33.3 g/dL (ref 31.5–35.7)
MCV: 95 fL (ref 79–97)
Monocytes Absolute: 0.6 10*3/uL (ref 0.1–0.9)
Monocytes: 10 %
Neutrophils Absolute: 2 10*3/uL (ref 1.4–7.0)
Neutrophils: 37 %
Platelets: 186 10*3/uL (ref 150–450)
RBC: 4.1 x10E6/uL — ABNORMAL LOW (ref 4.14–5.80)
RDW: 12.9 % (ref 11.6–15.4)
WBC: 5.5 10*3/uL (ref 3.4–10.8)

## 2019-08-01 LAB — VITAMIN D 25 HYDROXY (VIT D DEFICIENCY, FRACTURES): Vit D, 25-Hydroxy: 25.4 ng/mL — ABNORMAL LOW (ref 30.0–100.0)

## 2019-08-01 LAB — TSH+FREE T4
Free T4: 1.11 ng/dL (ref 0.82–1.77)
TSH: 2.76 u[IU]/mL (ref 0.450–4.500)

## 2019-08-01 LAB — PSA TOTAL (REFLEX TO FREE): Prostate Specific Ag, Serum: 0.6 ng/mL (ref 0.0–4.0)

## 2019-08-01 MED ORDER — VITAMIN D (ERGOCALCIFEROL) 1.25 MG (50000 UNIT) PO CAPS: 50000.0000 [IU] | ORAL_CAPSULE | ORAL | 1 refills | Status: DC

## 2019-08-08 ENCOUNTER — Other Ambulatory Visit: Payer: Self-pay | Admitting: Family Medicine

## 2019-08-08 DIAGNOSIS — E038 Other specified hypothyroidism: Secondary | ICD-10-CM

## 2019-08-08 DIAGNOSIS — I82401 Acute embolism and thrombosis of unspecified deep veins of right lower extremity: Secondary | ICD-10-CM

## 2019-08-08 DIAGNOSIS — G4701 Insomnia due to medical condition: Secondary | ICD-10-CM

## 2019-09-04 ENCOUNTER — Other Ambulatory Visit: Payer: Self-pay | Admitting: Family Medicine

## 2019-09-04 DIAGNOSIS — F418 Other specified anxiety disorders: Secondary | ICD-10-CM

## 2019-09-04 DIAGNOSIS — N529 Male erectile dysfunction, unspecified: Secondary | ICD-10-CM

## 2019-09-07 ENCOUNTER — Other Ambulatory Visit: Payer: Self-pay | Admitting: Family Medicine

## 2019-09-07 DIAGNOSIS — G4701 Insomnia due to medical condition: Secondary | ICD-10-CM

## 2019-09-18 ENCOUNTER — Encounter (INDEPENDENT_AMBULATORY_CARE_PROVIDER_SITE_OTHER): Payer: Medicare Other | Admitting: Ophthalmology

## 2019-10-03 ENCOUNTER — Ambulatory Visit (INDEPENDENT_AMBULATORY_CARE_PROVIDER_SITE_OTHER): Payer: Medicare Other | Admitting: Family Medicine

## 2019-10-03 ENCOUNTER — Encounter: Payer: Self-pay | Admitting: Family Medicine

## 2019-10-03 DIAGNOSIS — J0100 Acute maxillary sinusitis, unspecified: Secondary | ICD-10-CM

## 2019-10-03 MED ORDER — AMOXICILLIN-POT CLAVULANATE 875-125 MG PO TABS: 1.0000 | ORAL_TABLET | Freq: Two times a day (BID) | ORAL | 0 refills | Status: AC

## 2019-10-03 MED ORDER — PREDNISONE 10 MG (21) PO TBPK: ORAL_TABLET | ORAL | 0 refills | Status: DC

## 2019-10-03 NOTE — Progress Notes (Signed)
Virtual Visit via Telephone Note  I connected with [name] on 10/03/19 at 4:04 PM by telephone and verified that I am speaking with the correct person using two identifiers. [name] is currently located at home and nobody is currently with him during this visit. The provider, Loman Brooklyn, FNP is located in their home at time of visit.  I discussed the limitations, risks, security and privacy concerns of performing an evaluation and management service by telephone and the availability of in person appointments. I also discussed with the patient that there may be a patient responsible charge related to this service. The patient expressed understanding and agreed to proceed.  Subjective: PCP: Christopher Fraise, MD  Chief Complaint  Patient presents with  . URI   Patient complains of head congestion, headache, runny nose, facial pain/pressure, postnasal drainage, loss of taste and loss of smell. Onset of symptoms was 5 days ago, gradually worsening since that time. He is drinking plenty of fluids. Evaluation to date: none. Treatment to date: none. He does smoke.    ROS: Per HPI  Current Outpatient Medications:  .  atropine 1 % ophthalmic solution, , Disp: , Rfl:  .  Azelastine HCl 0.15 % SOLN, Place 2 sprays into the nose 2 (two) times daily., Disp: 30 mL, Rfl: 5 .  B Complex-C (SUPER B COMPLEX PO), Take by mouth., Disp: , Rfl:  .  brimonidine (ALPHAGAN) 0.2 % ophthalmic solution, , Disp: , Rfl:  .  co-enzyme Q-10 30 MG capsule, Take 30 mg by mouth daily., Disp: , Rfl:  .  Cyanocobalamin (VITAMIN B12 PO), Take by mouth., Disp: , Rfl:  .  hydrOXYzine (ATARAX/VISTARIL) 50 MG tablet, Take 50 mg by mouth at bedtime., Disp: , Rfl:  .  ketorolac (ACULAR) 0.5 % ophthalmic solution, , Disp: , Rfl:  .  levothyroxine (SYNTHROID) 50 MCG tablet, TAKE 1 TABLET ONCE A DAY BEFORE BREAKFAST. Needs labs, Disp: 30 tablet, Rfl: 5 .  ofloxacin (OCUFLOX) 0.3 % ophthalmic solution, , Disp: , Rfl:   .  oxyCODONE-acetaminophen (PERCOCET) 10-325 MG tablet, , Disp: , Rfl:  .  prednisoLONE acetate (PRED FORTE) 1 % ophthalmic suspension, , Disp: , Rfl:  .  pregabalin (LYRICA) 200 MG capsule, Take 1 capsule (200 mg total) by mouth daily., Disp: 90 capsule, Rfl: 1 .  QUEtiapine (SEROQUEL) 300 MG tablet, Take 1 tablet (300 mg total) by mouth at bedtime., Disp: 30 tablet, Rfl: 2 .  rivaroxaban (XARELTO) 20 MG TABS tablet, Take 1 tablet (20 mg total) by mouth daily with supper., Disp: 90 tablet, Rfl: 1 .  sildenafil (REVATIO) 20 MG tablet, 2-5  Once a day as needed for intimacy, Disp: 50 tablet, Rfl: 5 .  traZODone (DESYREL) 150 MG tablet, Use from 1/3 to 1 tablet nightly as needed for sleep., Disp: 30 tablet, Rfl: 5 .  Vitamin D, Ergocalciferol, (DRISDOL) 1.25 MG (50000 UNIT) CAPS capsule, Take 1 capsule (50,000 Units total) by mouth every 7 (seven) days., Disp: 13 capsule, Rfl: 1 .  zolpidem (AMBIEN CR) 12.5 MG CR tablet, Take 1 tablet (12.5 mg total) by mouth at bedtime as needed for sleep., Disp: 90 tablet, Rfl: 0  No Known Allergies Past Medical History:  Diagnosis Date  . Anxiety   . Constipation   . Depression   . Hepatitis    Hepatitis C  . History of DVT (deep vein thrombosis)   . Hypercholesterolemia   . Hypothyroidism   . Unspecified venous (peripheral)  insufficiency     Observations/Objective: A&O  No respiratory distress or wheezing audible over the phone Mood, judgement, and thought processes all WNL  Assessment and Plan: 1. Acute non-recurrent maxillary sinusitis - Discussed symptom management. Encouraged patient to go get tested for COVID-19 but he declined.  - amoxicillin-clavulanate (AUGMENTIN) 875-125 MG tablet; Take 1 tablet by mouth 2 (two) times daily for 7 days.  Dispense: 14 tablet; Refill: 0 - predniSONE (STERAPRED UNI-PAK 21 TAB) 10 MG (21) TBPK tablet; As directed x 6 days  Dispense: 21 tablet; Refill: 0   Follow Up Instructions:  I discussed the  assessment and treatment plan with the patient. The patient was provided an opportunity to ask questions and all were answered. The patient agreed with the plan and demonstrated an understanding of the instructions.   The patient was advised to call back or seek an in-person evaluation if the symptoms worsen or if the condition fails to improve as anticipated.  The above assessment and management plan was discussed with the patient. The patient verbalized understanding of and has agreed to the management plan. Patient is aware to call the clinic if symptoms persist or worsen. Patient is aware when to return to the clinic for a follow-up visit. Patient educated on when it is appropriate to go to the emergency department.   Time call ended: 4:15 PM  I provided 13 minutes of non-face-to-face time during this encounter.  Hendricks Limes, MSN, APRN, FNP-C [facility] 10/03/19

## 2019-10-21 ENCOUNTER — Telehealth: Payer: Self-pay | Admitting: Family Medicine

## 2019-10-21 NOTE — Telephone Encounter (Signed)
Pt called stating that he needs Dr Livia Snellen to send in a prescription/order to [facility] for prosthetic supplies, right leg.  [employer] Phone# [phone] Fax# [phone]

## 2019-10-22 ENCOUNTER — Other Ambulatory Visit: Payer: Self-pay | Admitting: Family Medicine

## 2019-10-22 DIAGNOSIS — G546 Phantom limb syndrome with pain: Secondary | ICD-10-CM

## 2019-10-22 NOTE — Telephone Encounter (Signed)
Scrip printed. Please fax. Thanks, WS

## 2019-10-22 NOTE — Telephone Encounter (Signed)
Faxed

## 2019-10-30 ENCOUNTER — Other Ambulatory Visit: Payer: Self-pay | Admitting: Family Medicine

## 2019-10-30 DIAGNOSIS — G4701 Insomnia due to medical condition: Secondary | ICD-10-CM

## 2019-10-30 NOTE — Telephone Encounter (Signed)
Authorize 30 days only. Then contact the patient letting them know that they will need an appointment before any further prescriptions can be sent in. 

## 2019-10-30 NOTE — Telephone Encounter (Signed)
Last office visit 07/31/2019 Last refill 07/31/2019, #90, no refills Patient instructed to follow up in 6 months

## 2019-11-14 ENCOUNTER — Other Ambulatory Visit: Payer: Self-pay | Admitting: *Deleted

## 2019-11-14 DIAGNOSIS — J0100 Acute maxillary sinusitis, unspecified: Secondary | ICD-10-CM

## 2019-11-25 ENCOUNTER — Ambulatory Visit: Payer: Medicare Other | Admitting: Family Medicine

## 2019-11-25 ENCOUNTER — Other Ambulatory Visit: Payer: Self-pay

## 2019-11-25 ENCOUNTER — Encounter: Payer: Self-pay | Admitting: Family Medicine

## 2019-11-25 ENCOUNTER — Other Ambulatory Visit: Payer: Self-pay | Admitting: Family Medicine

## 2019-11-25 VITALS — BP 91/71 | HR 86 | Temp 97.9°F | Ht 68.0 in | Wt 211.0 lb

## 2019-11-25 DIAGNOSIS — G546 Phantom limb syndrome with pain: Secondary | ICD-10-CM | POA: Diagnosis not present

## 2019-11-25 DIAGNOSIS — I82501 Chronic embolism and thrombosis of unspecified deep veins of right lower extremity: Secondary | ICD-10-CM

## 2019-11-25 DIAGNOSIS — G4701 Insomnia due to medical condition: Secondary | ICD-10-CM

## 2019-11-25 DIAGNOSIS — F1320 Sedative, hypnotic or anxiolytic dependence, uncomplicated: Secondary | ICD-10-CM

## 2019-11-25 DIAGNOSIS — R7989 Other specified abnormal findings of blood chemistry: Secondary | ICD-10-CM

## 2019-11-25 DIAGNOSIS — E038 Other specified hypothyroidism: Secondary | ICD-10-CM

## 2019-11-25 DIAGNOSIS — Z79899 Other long term (current) drug therapy: Secondary | ICD-10-CM

## 2019-11-25 DIAGNOSIS — F418 Other specified anxiety disorders: Secondary | ICD-10-CM

## 2019-11-25 NOTE — Progress Notes (Signed)
Subjective:  Patient ID: [name], male    DOB: 24-Feb-1961  Age: 59 y.o. MRN: [id number]  CC: Medication Refill   HPI [name] presents for follow up on use of zolpidem for sleep. Has been taking it for many years due to sleep disturbance brought on by  phantom pain. Pt. Also has a relationship with a pain clinic for relief of the pain using opiates and Lyrica. His LME due to the Ambien is 0.62..   follow-up on  thyroid. The patient has a history of hypothyroidism for many years. It has been stable recently. Pt. denies any change in  voice, loss of hair, heat or cold intolerance. Energy level has been adequate to good. Patient denies constipation and diarrhea. No myxedema. Medication is as noted below. Verified that pt is taking it daily on an empty stomach. Well tolerated.  GAD 7 : Generalized Anxiety Score 11/25/2019  Nervous, Anxious, on Edge 1  Control/stop worrying 0  Worry too much - different things 0  Trouble relaxing 0  Restless 1  Easily annoyed or irritable 0  Afraid - awful might happen 0  Total GAD 7 Score 2  Anxiety Difficulty Somewhat difficult     Depression screen [facility] 2/9 11/25/2019 07/31/2019 08/30/2018  Decreased Interest 0 0 1  Down, Depressed, Hopeless 0 0 1  PHQ - 2 Score 0 0 2  Altered sleeping - 0 3  Tired, decreased energy - 0 3  Change in appetite - 0 1  Feeling bad or failure about yourself  - 0 0  Trouble concentrating - 0 1  Moving slowly or fidgety/restless - 0 0  Suicidal thoughts - 0 0  PHQ-9 Score - 0 10    Depression and anxiety are stable with current regimen.  History Sylas has a past medical history of Anxiety, Constipation, Depression, Hepatitis, History of DVT (deep vein thrombosis), Hypercholesterolemia, Hypothyroidism, and Unspecified venous (peripheral) insufficiency.   He has a past surgical history that includes Back surgery; Foot surgery (Right); Below knee leg amputation (Right); Colonoscopy with propofol (N/A,  06/26/2015); and polypectomy (06/26/2015).   His family history includes Arthritis in his brother; Cancer in his brother and father; Depression in his brother; Mental illness in his brother.He reports that he has been smoking. He has a 19.00 pack-year smoking history. He has never used smokeless tobacco. He reports that he does not drink alcohol or use drugs.    ROS Review of Systems  Constitutional: Negative for fever.  Respiratory: Negative for shortness of breath.   Cardiovascular: Negative for chest pain.  Musculoskeletal: Negative for arthralgias.  Skin: Negative for rash.    Objective:  BP 91/71   Pulse 86   Temp 97.9 F (36.6 C) (Temporal)   Ht '5\' 8"'$  (1.727 m)   Wt 211 lb (95.7 kg)   BMI 32.08 kg/m   BP Readings from Last 3 Encounters:  11/25/19 91/71  07/31/19 (!) 88/66  08/30/18 95/67    Wt Readings from Last 3 Encounters:  11/25/19 211 lb (95.7 kg)  07/31/19 214 lb (97.1 kg)  08/30/18 207 lb 8 oz (94.1 kg)     Physical Exam Vitals reviewed.  Constitutional:      Appearance: He is well-developed.  HENT:     Head: Normocephalic and atraumatic.     Right Ear: Tympanic membrane and external ear normal. No decreased hearing noted.     Left Ear: Tympanic membrane and external ear normal. No decreased hearing noted.  Mouth/Throat:     Pharynx: No oropharyngeal exudate or posterior oropharyngeal erythema.  Eyes:     Pupils: Pupils are equal, round, and reactive to light.  Cardiovascular:     Rate and Rhythm: Normal rate and regular rhythm.     Heart sounds: No murmur.  Pulmonary:     Effort: No respiratory distress.     Breath sounds: Normal breath sounds.  Abdominal:     General: Bowel sounds are normal.     Palpations: Abdomen is soft. There is no mass.     Tenderness: There is no abdominal tenderness.  Musculoskeletal:     Cervical back: Normal range of motion and neck supple.       Assessment & Plan:   Tammie was seen today for medication  refill.  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 Derstine's co-enzyme Q-10, oxyCODONE-acetaminophen, brimonidine, atropine, ketorolac, ofloxacin, prednisoLONE acetate, and predniSONE. I am also having him maintain his B Complex-C (SUPER B COMPLEX PO), Cyanocobalamin (VITAMIN B12 PO), hydrOXYzine, pregabalin, Azelastine HCl, levothyroxine, rivaroxaban, QUEtiapine, sildenafil, traZODone, and Vitamin D (Ergocalciferol).  Allergies as of 11/25/2019   No Known Allergies     Medication List       Accurate as of Nov 25, 2019 10:37 PM. If you have any questions, ask your nurse or doctor.        STOP taking these medications   atropine 1 % ophthalmic solution Stopped by: Claretta Fraise, MD   brimonidine 0.2 % ophthalmic solution Commonly known as: ALPHAGAN Stopped by: Claretta Fraise, MD   co-enzyme Q-10 30 MG capsule Stopped by: Claretta Fraise, MD   ketorolac 0.5 % ophthalmic solution Commonly known as: ACULAR Stopped by: Claretta Fraise, MD   ofloxacin 0.3 % ophthalmic solution Commonly known as: OCUFLOX Stopped by: Claretta Fraise, MD   oxyCODONE-acetaminophen 10-325 MG tablet Commonly known as: PERCOCET Stopped by: Claretta Fraise, MD   prednisoLONE acetate 1 %  ophthalmic suspension Commonly known as: PRED FORTE Stopped by: Claretta Fraise, MD   predniSONE 10 MG (21) Tbpk tablet Commonly known as: STERAPRED UNI-PAK 21 TAB Stopped by: Claretta Fraise, MD     TAKE these medications   Azelastine HCl 0.15 % Soln Place 2 sprays into the nose 2 (two) times daily.   hydrOXYzine 50 MG tablet Commonly known as: ATARAX/VISTARIL Take 50 mg by mouth at bedtime.   levothyroxine 50 MCG tablet Commonly known as: SYNTHROID TAKE 1 TABLET ONCE A DAY BEFORE BREAKFAST. Needs labs   pregabalin 200 MG capsule Commonly known as: LYRICA Take 1 capsule (200 mg total) by mouth  daily.   QUEtiapine 300 MG tablet Commonly known as: SEROquel Take 1 tablet (300 mg total) by mouth at bedtime.   rivaroxaban 20 MG Tabs tablet Commonly known as: XARELTO Take 1 tablet (20 mg total) by mouth daily with supper.   sildenafil 20 MG tablet Commonly known as: REVATIO 2-5  Once a day as needed for intimacy   SUPER B COMPLEX PO Take by mouth.   traZODone 150 MG tablet Commonly known as: DESYREL Use from 1/3 to 1 tablet nightly as needed for sleep.   VITAMIN B12 PO Take by mouth.   Vitamin D (Ergocalciferol) 1.25 MG (50000 UNIT) Caps capsule Commonly known as: DRISDOL Take 1 capsule (50,000 Units total) by mouth every 7 (seven) days.   zolpidem 12.5 MG CR tablet Commonly known as: AMBIEN CR TAKE 1 TABLET BY MOUTH AT BEDTIME AS NEEDED FOR SLEEP.        Follow-up: Return in about 6 months (around 05/27/2020).  Claretta Fraise, M.D.

## 2019-11-26 ENCOUNTER — Other Ambulatory Visit: Payer: Self-pay | Admitting: Family Medicine

## 2019-11-26 DIAGNOSIS — E038 Other specified hypothyroidism: Secondary | ICD-10-CM

## 2019-11-26 LAB — CBC WITH DIFFERENTIAL/PLATELET
Basophils Absolute: 0 10*3/uL (ref 0.0–0.2)
Basos: 0 %
EOS (ABSOLUTE): 0.1 10*3/uL (ref 0.0–0.4)
Eos: 1 %
Hematocrit: 39.9 % (ref 37.5–51.0)
Hemoglobin: 13.4 g/dL (ref 13.0–17.7)
Immature Grans (Abs): 0.1 10*3/uL (ref 0.0–0.1)
Immature Granulocytes: 1 %
Lymphocytes Absolute: 4.8 10*3/uL — ABNORMAL HIGH (ref 0.7–3.1)
Lymphs: 48 %
MCH: 32.5 pg (ref 26.6–33.0)
MCHC: 33.6 g/dL (ref 31.5–35.7)
MCV: 97 fL (ref 79–97)
Monocytes Absolute: 0.8 10*3/uL (ref 0.1–0.9)
Monocytes: 8 %
Neutrophils Absolute: 4.3 10*3/uL (ref 1.4–7.0)
Neutrophils: 42 %
Platelets: 224 10*3/uL (ref 150–450)
RBC: 4.12 x10E6/uL — ABNORMAL LOW (ref 4.14–5.80)
RDW: 12.6 % (ref 11.6–15.4)
WBC: 10.1 10*3/uL (ref 3.4–10.8)

## 2019-11-26 LAB — CMP14+EGFR
ALT: 20 IU/L (ref 0–44)
AST: 24 IU/L (ref 0–40)
Albumin/Globulin Ratio: 1.1 — ABNORMAL LOW (ref 1.2–2.2)
Albumin: 3.5 g/dL — ABNORMAL LOW (ref 3.8–4.9)
Alkaline Phosphatase: 61 IU/L (ref 48–121)
BUN/Creatinine Ratio: 6 — ABNORMAL LOW (ref 9–20)
BUN: 7 mg/dL (ref 6–24)
Bilirubin Total: 0.3 mg/dL (ref 0.0–1.2)
CO2: 29 mmol/L (ref 20–29)
Calcium: 8.8 mg/dL (ref 8.7–10.2)
Chloride: 99 mmol/L (ref 96–106)
Creatinine, Ser: 1.09 mg/dL (ref 0.76–1.27)
GFR calc Af Amer: 86 mL/min/{1.73_m2} (ref >59)
GFR calc non Af Amer: 74 mL/min/{1.73_m2} (ref >59)
Globulin, Total: 3.1 g/dL (ref 1.5–4.5)
Glucose: 78 mg/dL (ref 65–99)
Potassium: 4.6 mmol/L (ref 3.5–5.2)
Sodium: 139 mmol/L (ref 134–144)
Total Protein: 6.6 g/dL (ref 6.0–8.5)

## 2019-11-26 LAB — TSH+FREE T4
Free T4: 1.26 ng/dL (ref 0.82–1.77)
TSH: 5.02 u[IU]/mL — ABNORMAL HIGH (ref 0.450–4.500)

## 2019-11-26 MED ORDER — LEVOTHYROXINE SODIUM 75 MCG PO TABS: ORAL_TABLET | ORAL | 2 refills | Status: DC

## 2019-11-27 NOTE — Addendum Note (Signed)
Addended by: Karle Plumber on: 11/27/2019 09:14 AM   Modules accepted: Orders

## 2019-11-28 LAB — TOXASSURE SELECT 13 (MW), URINE

## 2019-12-03 ENCOUNTER — Other Ambulatory Visit: Payer: Self-pay

## 2019-12-03 DIAGNOSIS — E038 Other specified hypothyroidism: Secondary | ICD-10-CM

## 2020-01-23 ENCOUNTER — Other Ambulatory Visit: Payer: Self-pay | Admitting: Family Medicine

## 2020-01-23 DIAGNOSIS — G4701 Insomnia due to medical condition: Secondary | ICD-10-CM

## 2020-01-27 ENCOUNTER — Telehealth: Payer: Self-pay | Admitting: Family Medicine

## 2020-01-27 NOTE — Telephone Encounter (Signed)
Patient aware he will need to be seen.  Patient states he will call back to schedule an appt

## 2020-01-27 NOTE — Telephone Encounter (Signed)
Pt. Needs to be seen for this. Thanks, WS 

## 2020-01-27 NOTE — Telephone Encounter (Signed)
Pt asking for 8 20mg  Cialis tablets to be sent in for him. Pt aware an appt may be needed as Dr. Livia Snellen has never prescribed him this medication.

## 2020-02-10 ENCOUNTER — Other Ambulatory Visit: Payer: Self-pay | Admitting: Family Medicine

## 2020-02-10 DIAGNOSIS — F418 Other specified anxiety disorders: Secondary | ICD-10-CM

## 2020-02-28 ENCOUNTER — Other Ambulatory Visit: Payer: Self-pay | Admitting: Family Medicine

## 2020-02-28 DIAGNOSIS — G4701 Insomnia due to medical condition: Secondary | ICD-10-CM

## 2020-02-28 DIAGNOSIS — I82401 Acute embolism and thrombosis of unspecified deep veins of right lower extremity: Secondary | ICD-10-CM

## 2020-03-11 ENCOUNTER — Ambulatory Visit: Payer: Medicare Other | Admitting: Family Medicine

## 2020-03-12 ENCOUNTER — Encounter: Payer: Self-pay | Admitting: Family Medicine

## 2020-03-17 ENCOUNTER — Encounter: Payer: Self-pay | Admitting: Family Medicine

## 2020-03-17 ENCOUNTER — Other Ambulatory Visit: Payer: Self-pay | Admitting: Family Medicine

## 2020-03-17 ENCOUNTER — Ambulatory Visit (INDEPENDENT_AMBULATORY_CARE_PROVIDER_SITE_OTHER): Payer: Medicare Other | Admitting: Family Medicine

## 2020-03-17 DIAGNOSIS — E038 Other specified hypothyroidism: Secondary | ICD-10-CM

## 2020-03-17 DIAGNOSIS — J0100 Acute maxillary sinusitis, unspecified: Secondary | ICD-10-CM | POA: Diagnosis not present

## 2020-03-17 MED ORDER — AMOXICILLIN-POT CLAVULANATE 875-125 MG PO TABS: 1.0000 | ORAL_TABLET | Freq: Two times a day (BID) | ORAL | 0 refills | Status: DC

## 2020-03-17 MED ORDER — PSEUDOEPHEDRINE-GUAIFENESIN ER 120-1200 MG PO TB12: 1.0000 | ORAL_TABLET | Freq: Two times a day (BID) | ORAL | 0 refills | Status: DC

## 2020-03-17 NOTE — Progress Notes (Signed)
Subjective:    Patient ID: [name], male    DOB: 1961-04-13, 59 y.o.   MRN: [id number]   HPI: [name] is a 59 y.o. male presenting for Symptoms include congestion, facial pain, nasal congestion, green rhinorrhea,  non productive cough, post nasal drip and sinus pressure. There is no fever, chills, or sweats. Onset of symptoms was 3-4 days ago, gradually worsening since that time. Not sleeping well.     Depression screen [facility] 2/9 11/25/2019 07/31/2019 08/30/2018 01/31/2018 04/25/2017  Decreased Interest 0 0 1 0 0  Down, Depressed, Hopeless 0 0 1 0 0  PHQ - 2 Score 0 0 2 0 0  Altered sleeping - 0 3 - -  Tired, decreased energy - 0 3 - -  Change in appetite - 0 1 - -  Feeling bad or failure about yourself  - 0 0 - -  Trouble concentrating - 0 1 - -  Moving slowly or fidgety/restless - 0 0 - -  Suicidal thoughts - 0 0 - -  PHQ-9 Score - 0 10 - -     Relevant past medical, surgical, family and social history reviewed and updated as indicated.  Interim medical history since our last visit reviewed. Allergies and medications reviewed and updated.  ROS:  Review of Systems  Constitutional: Negative for fever.  HENT: Positive for congestion, postnasal drip, rhinorrhea and sinus pressure.   Respiratory: Negative for shortness of breath.   Cardiovascular: Negative for chest pain.  Skin: Negative for rash.     Social History   Tobacco Use  Smoking Status Current Every Day Smoker  . Packs/day: 1.00  . Years: 19.00  . Pack years: 19.00  Smokeless Tobacco Never Used       Objective:     Wt Readings from Last 3 Encounters:  11/25/19 211 lb (95.7 kg)  07/31/19 214 lb (97.1 kg)  08/30/18 207 lb 8 oz (94.1 kg)     Exam deferred. Pt. Harboring due to COVID 19. Phone visit performed.   Assessment & Plan:  No diagnosis found.  Meds ordered this encounter  Medications  . amoxicillin-clavulanate (AUGMENTIN) 875-125 MG tablet    Sig: Take 1 tablet by mouth 2 (two)  times daily. Take all of this medication    Dispense:  20 tablet    Refill:  0  . Pseudoephedrine-Guaifenesin 321 279 1974 MG TB12    Sig: Take 1 tablet by mouth 2 (two) times daily. For congestion    Dispense:  20 tablet    Refill:  0    No orders of the defined types were placed in this encounter.     There are no diagnoses linked to this encounter.  Virtual Visit via telephone Note  I discussed the limitations, risks, security and privacy concerns of performing an evaluation and management service by telephone and the availability of in person appointments. The patient was identified with two identifiers. Pt.expressed understanding and agreed to proceed. Pt. Is at home. Dr. Livia Snellen is in his office.  Follow Up Instructions:   I discussed the assessment and treatment plan with the patient. The patient was provided an opportunity to ask questions and all were answered. The patient agreed with the plan and demonstrated an understanding of the instructions.   The patient was advised to call back or seek an in-person evaluation if the symptoms worsen or if the condition fails to improve as anticipated.   Total minutes including chart review and phone contact  time: 8   Follow up plan: No follow-ups on file.  Claretta Fraise, MD [place]

## 2020-04-03 ENCOUNTER — Other Ambulatory Visit: Payer: Self-pay | Admitting: Family Medicine

## 2020-04-03 DIAGNOSIS — G4701 Insomnia due to medical condition: Secondary | ICD-10-CM

## 2020-04-03 DIAGNOSIS — I82401 Acute embolism and thrombosis of unspecified deep veins of right lower extremity: Secondary | ICD-10-CM

## 2020-04-29 ENCOUNTER — Other Ambulatory Visit: Payer: Self-pay | Admitting: Family Medicine

## 2020-04-29 DIAGNOSIS — G4701 Insomnia due to medical condition: Secondary | ICD-10-CM

## 2020-04-30 ENCOUNTER — Other Ambulatory Visit: Payer: Self-pay

## 2020-04-30 NOTE — Telephone Encounter (Signed)
Pt aware that he should have enough medication till his appt and he understands

## 2020-04-30 NOTE — Telephone Encounter (Signed)
Talked with pharmacy, pt had medication filled on 5/17, 6/12, 7/6, 8/2, 8/31 & 9/24 in calculating this with the pharmacy, pt should have enough until his appt on 05/27/20. Also discussion with Dr. Livia Snellen prior to call to pharmacy the original Rx was written to get patient to his 6 mos appt.

## 2020-04-30 NOTE — Telephone Encounter (Signed)
  Prescription Request  04/30/2020  What is the name of the medication or equipment? AMBIEN / PT SAID HE IS DUE ON Monday THE 24TH  Have you contacted your pharmacy to request a refill? (if applicable) YES   Which pharmacy would you like this sent to? [facility]   Patient notified that their request is being sent to the clinical staff for review and that they should receive a response within 2 business days.

## 2020-05-01 ENCOUNTER — Other Ambulatory Visit: Payer: Self-pay | Admitting: Family Medicine

## 2020-05-01 DIAGNOSIS — E038 Other specified hypothyroidism: Secondary | ICD-10-CM

## 2020-05-27 ENCOUNTER — Encounter: Payer: Self-pay | Admitting: Family Medicine

## 2020-05-27 ENCOUNTER — Other Ambulatory Visit: Payer: Self-pay

## 2020-05-27 ENCOUNTER — Ambulatory Visit: Payer: Medicare Other | Admitting: Family Medicine

## 2020-05-27 VITALS — BP 107/80 | HR 76 | Temp 98.1°F | Ht 68.0 in | Wt 217.6 lb

## 2020-05-27 DIAGNOSIS — E038 Other specified hypothyroidism: Secondary | ICD-10-CM | POA: Diagnosis not present

## 2020-05-27 DIAGNOSIS — G4701 Insomnia due to medical condition: Secondary | ICD-10-CM | POA: Diagnosis not present

## 2020-05-27 DIAGNOSIS — Z9114 Patient's other noncompliance with medication regimen: Secondary | ICD-10-CM

## 2020-05-27 DIAGNOSIS — I82401 Acute embolism and thrombosis of unspecified deep veins of right lower extremity: Secondary | ICD-10-CM

## 2020-05-27 MED ORDER — RIVAROXABAN 20 MG PO TABS: ORAL_TABLET | ORAL | 1 refills | Status: AC

## 2020-05-27 MED ORDER — LEVOTHYROXINE SODIUM 75 MCG PO TABS: ORAL_TABLET | ORAL | 1 refills | Status: AC

## 2020-05-27 NOTE — Progress Notes (Signed)
Subjective:  Patient ID: [name], male    DOB: 03-07-1961  Age: 59 y.o. MRN: [id number]  CC: Follow-up (6 month)   HPI [name] presents for follow-up on his various medical conditions including his thyroid.  He is also in need of a refill on his Ambien.  He is also taking Xarelto and Lyrica.  Today he tells me that his pain doctors physicians assistant is tapering him off of the Seroquel because he is taking too much sleep medicine.  Instead they gave him Xanax to take.  This is in addition to the Ambien that he was already taking plus oxycodone that they have been prescribing.  Patient is under a controlled substance agreement not to increase in any way is benzodiazepine due to the use of Ambien.  He also understood that Lyrica was part of this agreement as well.  As a result I told him he could no longer get any controlled substance from here.  Of note is that 2 to 3 years ago he violated his pain contract and that is why he was having to go to the pain clinic in the first place.  Since they have agreed to start him on benzodiazepines to go with his opiates, I have asked him to continue to see them regularly and he will no longer be seen here.  He was given a 30-day notice.  His Synthroid and Xarelto will be refilled.  At this point the pain clinic has taken over managing his sleep meds and his pain meds so he will no longer get Lyrica or Ambien from this office.  He will need to be seen for emergency only during this next month.  All of this was reviewed in detail with the patient.  He said he did not intentionally start taking the Xanax.  It was offered to him by the physicians assistant.  I explained further to him that being offered the medicine was not the violation, taking the medicine was.  Ironically the need to cut back on sleep medicine was not accomplished when Xanax was added to his regimen.  Depression screen [facility] 2/9 05/27/2020 11/25/2019 07/31/2019  Decreased Interest 0 0 0   Down, Depressed, Hopeless 0 0 0  PHQ - 2 Score 0 0 0  Altered sleeping - - 0  Tired, decreased energy - - 0  Change in appetite - - 0  Feeling bad or failure about yourself  - - 0  Trouble concentrating - - 0  Moving slowly or fidgety/restless - - 0  Suicidal thoughts - - 0  PHQ-9 Score - - 0    History [name] has a past medical history of Anxiety, Constipation, Depression, Hepatitis, History of DVT (deep vein thrombosis), Hypercholesterolemia, Hypothyroidism, and Unspecified venous (peripheral) insufficiency.   He has a past surgical history that includes Back surgery; Foot surgery (Right); Below knee leg amputation (Right); Colonoscopy with propofol (N/A, 06/26/2015); and polypectomy (06/26/2015).   His family history includes Arthritis in his brother; Cancer in his brother and father; Depression in his brother; Mental illness in his brother.He reports that he has been smoking. He has a 19.00 pack-year smoking history. He has never used smokeless tobacco. He reports that he does not drink alcohol and does not use drugs.    ROS Review of Systems  Objective:  BP 107/80   Pulse 76   Temp 98.1 F (36.7 C) (Temporal)   Ht 5\' 8"  (1.727 m)   Wt 217 lb 9.6  oz (98.7 kg)   BMI 33.09 kg/m   BP Readings from Last 3 Encounters:  05/27/20 107/80  11/25/19 91/71  07/31/19 (!) 88/66    Wt Readings from Last 3 Encounters:  05/27/20 217 lb 9.6 oz (98.7 kg)  11/25/19 211 lb (95.7 kg)  07/31/19 214 lb (97.1 kg)     Physical Exam  Patient is alert and oriented.  No apparent distress violation of pain contract  Assessment & Plan:  Consider controlled substance contract contract violation 1 month refills were sent for his Xarelto and Synthroid.  Encounter Diagnoses  Name Primary?  . Insomnia due to medical condition   . Acute deep vein thrombosis (DVT) of right lower extremity, unspecified vein (HCC)   . Other specified hypothyroidism   . Controlled substance agreement broken  Yes    Follow-up:   By emergency only between now and June 26, 2020.  Certified letter will be sent as a follow-up to my discussion with him.  [name], M.D.

## 2020-05-28 ENCOUNTER — Encounter: Payer: Self-pay | Admitting: Family Medicine

## 2020-07-07 ENCOUNTER — Encounter (INDEPENDENT_AMBULATORY_CARE_PROVIDER_SITE_OTHER): Payer: Self-pay | Admitting: *Deleted

## 2020-08-10 ENCOUNTER — Telehealth: Payer: Self-pay

## 2020-08-10 NOTE — Telephone Encounter (Signed)
Transition Care Management Unsuccessful Follow-up Telephone Call  Date of discharge and from where:  08/07/2020 from [facility] ED  Attempts:  1st Attempt  Reason for unsuccessful TCM follow-up call:  Left voice message

## 2020-08-11 NOTE — Telephone Encounter (Signed)
Transition Care Management Unsuccessful Follow-up Telephone Call  Date of discharge and from where:  08/07/20 from [facility] ED  Attempts:  2nd Attempt  Reason for unsuccessful TCM follow-up call:  Left voice message

## 2020-08-12 NOTE — Telephone Encounter (Signed)
Transition Care Management Unsuccessful Follow-up Telephone Call  Date of discharge and from where: 08/07/20 from [facility] ED  Attempts:  3rd Attempt  Reason for unsuccessful TCM follow-up call:  Left voice message

## 2020-12-11 ENCOUNTER — Other Ambulatory Visit: Payer: Self-pay | Admitting: Family Medicine

## 2020-12-11 DIAGNOSIS — J301 Allergic rhinitis due to pollen: Secondary | ICD-10-CM

## 2022-09-08 ENCOUNTER — Telehealth: Payer: Self-pay | Admitting: Family Medicine

## 2022-09-08 NOTE — Telephone Encounter (Signed)
Please remove PCP  stated he was discharged from practice in 2021
